# Patient Record
Sex: Male | Born: 1960 | Race: White | Hispanic: No | Marital: Married | Smoking: Never smoker
Health system: Southern US, Community
[De-identification: ages and names within clinical notes are randomized; demographics above are authoritative.]

## PROBLEM LIST (undated history)

## (undated) DIAGNOSIS — I10 Essential (primary) hypertension: Secondary | ICD-10-CM

## (undated) DIAGNOSIS — M109 Gout, unspecified: Secondary | ICD-10-CM

## (undated) HISTORY — PX: OTHER SURGICAL HISTORY: SHX169

## (undated) HISTORY — PX: TONSILLECTOMY: SHX5217

## (undated) HISTORY — DX: Gout, unspecified: M10.9

## (undated) HISTORY — DX: Essential (primary) hypertension: I10

## (undated) HISTORY — PX: HERNIA REPAIR: SHX51

---

## 2003-10-18 ENCOUNTER — Observation Stay (HOSPITAL_COMMUNITY): Admission: EM | Admit: 2003-10-18 | Discharge: 2003-10-18 | Payer: Self-pay | Admitting: Emergency Medicine

## 2013-07-16 ENCOUNTER — Telehealth: Payer: Self-pay | Admitting: Family Medicine

## 2013-07-16 MED ORDER — OLMESARTAN-AMLODIPINE-HCTZ 40-5-25 MG PO TABS: 1.0000 | ORAL_TABLET | Freq: Every day | ORAL | Status: DC

## 2013-07-16 MED ORDER — ALLOPURINOL 300 MG PO TABS: 300.0000 mg | ORAL_TABLET | Freq: Every day | ORAL | Status: DC

## 2013-07-16 NOTE — Telephone Encounter (Signed)
x

## 2013-07-18 ENCOUNTER — Other Ambulatory Visit: Payer: Self-pay | Admitting: Family Medicine

## 2013-07-18 MED ORDER — ALLOPURINOL 300 MG PO TABS: 300.0000 mg | ORAL_TABLET | Freq: Every day | ORAL | Status: DC

## 2013-07-18 NOTE — Telephone Encounter (Signed)
Refill for allopurinol sent in

## 2013-07-19 ENCOUNTER — Ambulatory Visit (INDEPENDENT_AMBULATORY_CARE_PROVIDER_SITE_OTHER): Payer: Managed Care, Other (non HMO) | Admitting: Family Medicine

## 2013-07-19 ENCOUNTER — Encounter: Payer: Self-pay | Admitting: Family Medicine

## 2013-07-19 VITALS — BP 143/86 | HR 79 | Temp 98.2°F | Ht 74.0 in | Wt 249.2 lb

## 2013-07-19 DIAGNOSIS — M109 Gout, unspecified: Secondary | ICD-10-CM

## 2013-07-19 DIAGNOSIS — Z0000 Encounter for general adult medical examination without abnormal findings: Secondary | ICD-10-CM

## 2013-07-19 DIAGNOSIS — I10 Essential (primary) hypertension: Secondary | ICD-10-CM

## 2013-07-19 LAB — POCT CBC
Granulocyte percent: 54.7 %G (ref 37–80)
HCT, POC: 44.8 % (ref 43.5–53.7)
Hemoglobin: 15.1 g/dL (ref 14.1–18.1)
Lymph, poc: 2.1 (ref 0.6–3.4)
MCH, POC: 32.3 pg — AB (ref 27–31.2)
MCHC: 33.8 g/dL (ref 31.8–35.4)
MCV: 95.7 fL (ref 80–97)
MPV: 8 fL (ref 0–99.8)
POC Granulocyte: 2.8 (ref 2–6.9)
POC LYMPH PERCENT: 41.2 %L (ref 10–50)
Platelet Count, POC: 211 10*3/uL (ref 142–424)
RBC: 4.7 M/uL (ref 4.69–6.13)
RDW, POC: 12.3 %
WBC: 5.1 10*3/uL (ref 4.6–10.2)

## 2013-07-19 MED ORDER — OLMESARTAN-AMLODIPINE-HCTZ 40-5-25 MG PO TABS: 1.0000 | ORAL_TABLET | Freq: Every day | ORAL | Status: DC

## 2013-07-19 MED ORDER — ALLOPURINOL 300 MG PO TABS: 300.0000 mg | ORAL_TABLET | Freq: Every day | ORAL | Status: DC

## 2013-07-19 MED ORDER — PREDNISONE 10 MG PO TABS: ORAL_TABLET | ORAL | Status: DC

## 2013-07-19 NOTE — Patient Instructions (Signed)
Gout  Gout is an inflammatory condition (arthritis) caused by a buildup of uric acid crystals in the joints. Uric acid is a chemical that is normally present in the blood. Under some circumstances, uric acid can form into crystals in your joints. This causes joint redness, soreness, and swelling (inflammation). Repeat attacks are common. Over time, uric acid crystals can form into masses (tophi) near a joint, causing disfigurement. Gout is treatable and often preventable.  CAUSES   The disease begins with elevated levels of uric acid in the blood. Uric acid is produced by your body when it breaks down a naturally found substance called purines. This also happens when you eat certain foods such as meats and fish. Causes of an elevated uric acid level include:   Being passed down from parent to child (heredity).   Diseases that cause increased uric acid production (obesity, psoriasis, some cancers).   Excessive alcohol use.   Diet, especially diets rich in meat and seafood.   Medicines, including certain cancer-fighting drugs (chemotherapy), diuretics, and aspirin.   Chronic kidney disease. The kidneys are no longer able to remove uric acid well.   Problems with metabolism.  Conditions strongly associated with gout include:   Obesity.   High blood pressure.   High cholesterol.   Diabetes.  Not everyone with elevated uric acid levels gets gout. It is not understood why some people get gout and others do not. Surgery, joint injury, and eating too much of certain foods are some of the factors that can lead to gout.  SYMPTOMS    An attack of gout comes on quickly. It causes intense pain with redness, swelling, and warmth in a joint.   Fever can occur.   Often, only one joint is involved. Certain joints are more commonly involved:   Base of the big toe.   Knee.   Ankle.   Wrist.   Finger.  Without treatment, an attack usually goes away in a few days to weeks. Between attacks, you usually will not have  symptoms, which is different from many other forms of arthritis.  DIAGNOSIS   Your caregiver will suspect gout based on your symptoms and exam. Removal of fluid from the joint (arthrocentesis) is done to check for uric acid crystals. Your caregiver will give you a medicine that numbs the area (local anesthetic) and use a needle to remove joint fluid for exam. Gout is confirmed when uric acid crystals are seen in joint fluid, using a special microscope. Sometimes, blood, urine, and X-ray tests are also used.  TREATMENT   There are 2 phases to gout treatment: treating the sudden onset (acute) attack and preventing attacks (prophylaxis).  Treatment of an Acute Attack   Medicines are used. These include anti-inflammatory medicines or steroid medicines.   An injection of steroid medicine into the affected joint is sometimes necessary.   The painful joint is rested. Movement can worsen the arthritis.   You may use warm or cold treatments on painful joints, depending which works best for you.   Discuss the use of coffee, vitamin C, or cherries with your caregiver. These may be helpful treatment options.  Treatment to Prevent Attacks  After the acute attack subsides, your caregiver may advise prophylactic medicine. These medicines either help your kidneys eliminate uric acid from your body or decrease your uric acid production. You may need to stay on these medicines for a very long time.  The early phase of treatment with prophylactic medicine can be associated   with an increase in acute gout attacks. For this reason, during the first few months of treatment, your caregiver may also advise you to take medicines usually used for acute gout treatment. Be sure you understand your caregiver's directions.  You should also discuss dietary treatment with your caregiver. Certain foods such as meats and fish can increase uric acid levels. Other foods such as dairy can decrease levels. Your caregiver can give you a list of foods  to avoid.  HOME CARE INSTRUCTIONS    Do not take aspirin to relieve pain. This raises uric acid levels.   Only take over-the-counter or prescription medicines for pain, discomfort, or fever as directed by your caregiver.   Rest the joint as much as possible. When in bed, keep sheets and blankets off painful areas.   Keep the affected joint raised (elevated).   Use crutches if the painful joint is in your leg.   Drink enough water and fluids to keep your urine clear or pale yellow. This helps your body get rid of uric acid. Do not drink alcoholic beverages. They slow the passage of uric acid.   Follow your caregiver's dietary instructions. Pay careful attention to the amount of protein you eat. Your daily diet should emphasize fruits, vegetables, whole grains, and fat-free or low-fat milk products.   Maintain a healthy body weight.  SEEK MEDICAL CARE IF:    You have an oral temperature above 102 F (38.9 C).   You develop diarrhea, vomiting, or any side effects from medicines.   You do not feel better in 24 hours, or you are getting worse.  SEEK IMMEDIATE MEDICAL CARE IF:    Your joint becomes suddenly more tender and you have:   Chills.   An oral temperature above 102 F (38.9 C), not controlled by medicine.  MAKE SURE YOU:    Understand these instructions.   Will watch your condition.   Will get help right away if you are not doing well or get worse.  Document Released: 10/15/2000 Document Revised: 01/10/2012 Document Reviewed: 01/26/2010  [employer] Patient Information 2014 [employer].

## 2013-07-19 NOTE — Progress Notes (Signed)
  Subjective:    Patient ID: Anthony Chavez, male    DOB: February 10, 1961, 52 y.o.   MRN: [id number]  HPI This 52 y.o. male presents for evaluation of needing med refill.  He has hx of gout and needs refills  On his allopurinol.  He has not had annual labs in over a year.   Review of Systems No chest pain, SOB, HA, dizziness, vision change, N/V, diarrhea, constipation, dysuria, urinary urgency or frequency, myalgias, arthralgias or rash.     Objective:   Physical Exam Vital signs noted  Well developed well nourished male.  HEENT - Head atraumatic Normocephalic                Eyes - PERRLA, Conjuctiva - clear Sclera- Clear EOMI                Ears - EAC's Wnl TM's Wnl Gross Hearing WNL                Nose - Nares patent                 Throat - oropharanx wnl Respiratory - Lungs CTA bilateral Cardiac - RRR S1 and S2 without murmur GI - Abdomen soft Nontender and bowel sounds active x 4 Extremities - No edema. Neuro - Grossly intact.       Assessment & Plan:  Essential hypertension, benign - Plan: Olmesartan-Amlodipine-HCTZ (TRIBENZOR) 40-5-25 MG TABS  Gout - Plan: allopurinol (ZYLOPRIM) 300 MG tablet, predniSONE (DELTASONE) 10 MG tablet, POCT CBC, CMP14+EGFR, Lipid panel, Uric acid, Thyroid Panel With TSH, PSA, total and free  Routine general medical examination at a health care facility - Plan: predniSONE (DELTASONE) 10 MG tablet, POCT CBC, CMP14+EGFR, Lipid panel, Uric acid, Thyroid Panel With TSH, PSA, total and free

## 2013-07-20 ENCOUNTER — Other Ambulatory Visit: Payer: Self-pay | Admitting: Family Medicine

## 2013-07-20 DIAGNOSIS — M109 Gout, unspecified: Secondary | ICD-10-CM

## 2013-07-20 DIAGNOSIS — E785 Hyperlipidemia, unspecified: Secondary | ICD-10-CM

## 2013-07-20 LAB — CMP14+EGFR
ALT: 37 IU/L (ref 0–44)
AST: 35 IU/L (ref 0–40)
Albumin/Globulin Ratio: 2.1 (ref 1.1–2.5)
Albumin: 5.1 g/dL (ref 3.5–5.5)
Alkaline Phosphatase: 52 IU/L (ref 39–117)
BUN/Creatinine Ratio: 12 (ref 9–20)
BUN: 14 mg/dL (ref 6–24)
CO2: 28 mmol/L (ref 18–29)
Calcium: 10.2 mg/dL (ref 8.7–10.2)
Chloride: 94 mmol/L — ABNORMAL LOW (ref 97–108)
Creatinine, Ser: 1.15 mg/dL (ref 0.76–1.27)
GFR calc Af Amer: 85 mL/min/{1.73_m2} (ref >59)
GFR calc non Af Amer: 73 mL/min/{1.73_m2} (ref >59)
Globulin, Total: 2.4 g/dL (ref 1.5–4.5)
Glucose: 97 mg/dL (ref 65–99)
Potassium: 4 mmol/L (ref 3.5–5.2)
Sodium: 141 mmol/L (ref 134–144)
Total Bilirubin: 1 mg/dL (ref 0.0–1.2)
Total Protein: 7.5 g/dL (ref 6.0–8.5)

## 2013-07-20 LAB — LIPID PANEL
Chol/HDL Ratio: 4.1 ratio units (ref 0.0–5.0)
Cholesterol, Total: 363 mg/dL — ABNORMAL HIGH (ref 100–199)
HDL: 88 mg/dL (ref >39)
LDL Calculated: 218 mg/dL — ABNORMAL HIGH (ref 0–99)
Triglycerides: 286 mg/dL — ABNORMAL HIGH (ref 0–149)
VLDL Cholesterol Cal: 57 mg/dL — ABNORMAL HIGH (ref 5–40)

## 2013-07-20 LAB — PSA, TOTAL AND FREE
PSA, Free Pct: 58.6 %
PSA, Free: 0.41 ng/mL
PSA: 0.7 ng/mL (ref 0.0–4.0)

## 2013-07-20 LAB — THYROID PANEL WITH TSH
Free Thyroxine Index: 2.7 (ref 1.2–4.9)
T3 Uptake Ratio: 31 % (ref 24–39)
T4, Total: 8.6 ug/dL (ref 4.5–12.0)
TSH: 3.32 u[IU]/mL (ref 0.450–4.500)

## 2013-07-20 LAB — URIC ACID: Uric Acid: 7.1 mg/dL (ref 3.7–8.6)

## 2013-07-20 MED ORDER — ALLOPURINOL 100 MG PO TABS: ORAL_TABLET | ORAL | Status: DC

## 2013-07-20 MED ORDER — ROSUVASTATIN CALCIUM 10 MG PO TABS: 10.0000 mg | ORAL_TABLET | Freq: Every day | ORAL | Status: DC

## 2013-07-30 ENCOUNTER — Telehealth: Payer: Self-pay | Admitting: Family Medicine

## 2014-08-19 ENCOUNTER — Telehealth: Payer: Self-pay | Admitting: Family Medicine

## 2014-08-19 NOTE — Telephone Encounter (Signed)
Need to know dose of allopurinol Check on tribenzor samples

## 2014-08-20 NOTE — Telephone Encounter (Signed)
Patient aware we do not carry those samples told to call back if he needs us to call them into a pharmacy.

## 2014-08-21 ENCOUNTER — Ambulatory Visit (INDEPENDENT_AMBULATORY_CARE_PROVIDER_SITE_OTHER): Payer: BC Managed Care – PPO | Admitting: Nurse Practitioner

## 2014-08-21 ENCOUNTER — Encounter: Payer: Self-pay | Admitting: Nurse Practitioner

## 2014-08-21 VITALS — BP 156/92 | HR 74 | Temp 98.6°F | Ht 74.0 in | Wt 254.0 lb

## 2014-08-21 DIAGNOSIS — M1A09X0 Idiopathic chronic gout, multiple sites, without tophus (tophi): Secondary | ICD-10-CM

## 2014-08-21 DIAGNOSIS — M109 Gout, unspecified: Secondary | ICD-10-CM | POA: Insufficient documentation

## 2014-08-21 DIAGNOSIS — Z125 Encounter for screening for malignant neoplasm of prostate: Secondary | ICD-10-CM

## 2014-08-21 DIAGNOSIS — E785 Hyperlipidemia, unspecified: Secondary | ICD-10-CM

## 2014-08-21 DIAGNOSIS — I10 Essential (primary) hypertension: Secondary | ICD-10-CM

## 2014-08-21 MED ORDER — ALLOPURINOL 100 MG PO TABS: ORAL_TABLET | ORAL | Status: DC

## 2014-08-21 MED ORDER — OLMESARTAN-AMLODIPINE-HCTZ 40-5-25 MG PO TABS: 1.0000 | ORAL_TABLET | Freq: Every day | ORAL | Status: DC

## 2014-08-21 NOTE — Patient Instructions (Signed)

## 2014-08-21 NOTE — Progress Notes (Signed)
Subjective:    Patient ID: Anthony Chavez, male    DOB: 02-Mar-1961, 53 y.o.   MRN: [id number]  Patient here today for follow up of chronic medical problems.  Hypertension This is a chronic problem. The current episode started more than 1 year ago. The problem has been waxing and waning since onset. The problem is uncontrolled (has been out of meds for a week.). Pertinent negatives include no chest pain, headaches, neck pain, palpitations, peripheral edema or sweats. Risk factors for coronary artery disease include dyslipidemia, obesity and male gender. Past treatments include angiotensin blockers, calcium channel blockers and diuretics. The current treatment provides significant improvement. Compliance problems include diet and exercise.   Hyperlipidemia This is a chronic problem. The current episode started more than 1 year ago. The problem is uncontrolled. Recent lipid tests were reviewed and are high. He has no history of obesity. Pertinent negatives include no chest pain. Treatments tried: was suppose to be on crestor but patient just didn't like taking so he stopped and has been watching diet. There are no compliance problems.  Risk factors for coronary artery disease include dyslipidemia, hypertension and male sex.  GOUT  No flare ups since initial flare-up- Is on allopurinol daily.   Review of Systems  Constitutional: Negative.   HENT: Negative.   Respiratory: Negative.   Cardiovascular: Negative for chest pain and palpitations.  Genitourinary: Negative.   Musculoskeletal: Negative for neck pain.  Neurological: Negative for headaches.  Psychiatric/Behavioral: Negative.   All other systems reviewed and are negative.      Objective:   Physical Exam  Constitutional: He is oriented to person, place, and time. He appears well-developed and well-nourished.  HENT:  Head: Normocephalic.  Right Ear: External ear normal.  Left Ear: External ear normal.  Nose: Nose normal.    Mouth/Throat: Oropharynx is clear and moist.  Eyes: EOM are normal. Pupils are equal, round, and reactive to light.  Neck: Normal range of motion. Neck supple. No JVD present. No thyromegaly present.  Cardiovascular: Normal rate, regular rhythm, normal heart sounds and intact distal pulses.  Exam reveals no gallop and no friction rub.   No murmur heard. Pulmonary/Chest: Effort normal and breath sounds normal. No respiratory distress. He has no wheezes. He has no rales. He exhibits no tenderness.  Abdominal: Soft. Bowel sounds are normal. He exhibits no mass. There is no tenderness.  Genitourinary:  Refuses prostate check.  Musculoskeletal: Normal range of motion. He exhibits no edema.  Lymphadenopathy:    He has no cervical adenopathy.  Neurological: He is alert and oriented to person, place, and time. No cranial nerve deficit.  Skin: Skin is warm and dry.  Psychiatric: He has a normal mood and affect. His behavior is normal. Judgment and thought content normal.   BP 156/92  Pulse 74  Temp(Src) 98.6 F (37 C) (Oral)  Ht _0  (1.88 m)  Wt 254 lb (115.214 kg)  BMI 32.60 kg/m2        Assessment & Plan:  1. Essential hypertension, benign Low NA+ diet Try not to run out of meds in the fiture - CMP14+EGFR - Olmesartan-Amlodipine-HCTZ (TRIBENZOR) 40-5-25 MG TABS; Take 1 tablet by mouth daily.  Dispense: 30 tablet; Refill: 11  2. Hyperlipidemia with target LDL less than 100 Low fat diet - NMR, lipoprofile  3. Idiopathic chronic gout of multiple sites without tophus Low purine diet - allopurinol (ZYLOPRIM) 100 MG tablet; Take one po in evening  Dispense: 30 tablet; Refill: 11  4. Prostate cancer screening - PSA, total and free   Refuses immunizations and colonoscopy Labs pending Health maintenance reviewed Diet and exercise encouraged Continue all meds Follow up  In 3 months   [place], FNP

## 2014-08-22 LAB — NMR, LIPOPROFILE
Cholesterol: 311 mg/dL — ABNORMAL HIGH (ref 100–199)
HDL Cholesterol by NMR: 84 mg/dL (ref >39)
HDL Particle Number: 44.7 umol/L (ref >=30.5)
LDL Particle Number: 1328 nmol/L — ABNORMAL HIGH (ref <1000)
LDL Size: 22 nm (ref >20.5)
LDLC SERPL CALC-MCNC: 200 mg/dL — ABNORMAL HIGH (ref 0–99)
LP-IR Score: <25 (ref <=45)
Small LDL Particle Number: 127 nmol/L (ref <=527)
Triglycerides by NMR: 134 mg/dL (ref 0–149)

## 2014-08-22 LAB — CMP14+EGFR
ALT: 35 IU/L (ref 0–44)
AST: 37 IU/L (ref 0–40)
Albumin/Globulin Ratio: 2.3 (ref 1.1–2.5)
Albumin: 4.8 g/dL (ref 3.5–5.5)
Alkaline Phosphatase: 40 IU/L (ref 39–117)
BUN/Creatinine Ratio: 16 (ref 9–20)
BUN: 19 mg/dL (ref 6–24)
CO2: 25 mmol/L (ref 18–29)
Calcium: 9.4 mg/dL (ref 8.7–10.2)
Chloride: 102 mmol/L (ref 97–108)
Creatinine, Ser: 1.2 mg/dL (ref 0.76–1.27)
GFR calc Af Amer: 80 mL/min/{1.73_m2} (ref >59)
GFR calc non Af Amer: 69 mL/min/{1.73_m2} (ref >59)
Globulin, Total: 2.1 g/dL (ref 1.5–4.5)
Glucose: 102 mg/dL — ABNORMAL HIGH (ref 65–99)
Potassium: 4.6 mmol/L (ref 3.5–5.2)
Sodium: 146 mmol/L — ABNORMAL HIGH (ref 134–144)
Total Bilirubin: 0.5 mg/dL (ref 0.0–1.2)
Total Protein: 6.9 g/dL (ref 6.0–8.5)

## 2014-08-22 LAB — PSA, TOTAL AND FREE
PSA, Free Pct: 53.3 %
PSA, Free: 0.32 ng/mL
PSA: 0.6 ng/mL (ref 0.0–4.0)

## 2014-08-27 ENCOUNTER — Telehealth: Payer: Self-pay | Admitting: Family Medicine

## 2014-08-27 NOTE — Telephone Encounter (Signed)
Message copied by Azalee CourseFULP, Mylin Hirano on Tue Aug 27, 2014 10:08 AM ------      Message from: Bennie PieriniMARTIN, MARY-MARGARET      Created: Thu Aug 22, 2014  6:14 PM       Kidney and liver function stable      Na+ is a little high- watch NA in diet      LDL particle numbers and LDL are elevated- really need to go on crestor      Continue current meds- low fat diet and exercise and recheck in 3 months             ------

## 2014-08-28 ENCOUNTER — Encounter: Payer: Self-pay | Admitting: Family Medicine

## 2015-07-31 ENCOUNTER — Other Ambulatory Visit: Payer: Self-pay | Admitting: Nurse Practitioner

## 2015-07-31 NOTE — Telephone Encounter (Signed)
Last seen 08/21/14  MMM 

## 2015-07-31 NOTE — Telephone Encounter (Signed)
Sent in 30 day prescription for both, needs to be seen before any more refills.

## 2015-08-25 ENCOUNTER — Encounter: Payer: Self-pay | Admitting: Family Medicine

## 2015-08-25 ENCOUNTER — Ambulatory Visit (INDEPENDENT_AMBULATORY_CARE_PROVIDER_SITE_OTHER): Payer: BLUE CROSS/BLUE SHIELD | Admitting: Family Medicine

## 2015-08-25 VITALS — BP 127/78 | HR 86 | Temp 98.1°F | Ht 74.0 in | Wt 229.0 lb

## 2015-08-25 DIAGNOSIS — Z1159 Encounter for screening for other viral diseases: Secondary | ICD-10-CM | POA: Insufficient documentation

## 2015-08-25 DIAGNOSIS — E785 Hyperlipidemia, unspecified: Secondary | ICD-10-CM

## 2015-08-25 DIAGNOSIS — Z0000 Encounter for general adult medical examination without abnormal findings: Secondary | ICD-10-CM | POA: Insufficient documentation

## 2015-08-25 DIAGNOSIS — N529 Male erectile dysfunction, unspecified: Secondary | ICD-10-CM

## 2015-08-25 DIAGNOSIS — M1A09X0 Idiopathic chronic gout, multiple sites, without tophus (tophi): Secondary | ICD-10-CM

## 2015-08-25 DIAGNOSIS — I10 Essential (primary) hypertension: Secondary | ICD-10-CM

## 2015-08-25 MED ORDER — SILDENAFIL CITRATE 50 MG PO TABS: 25.0000 mg | ORAL_TABLET | Freq: Every day | ORAL | Status: DC | PRN

## 2015-08-25 MED ORDER — OLMESARTAN-AMLODIPINE-HCTZ 40-5-25 MG PO TABS: 1.0000 | ORAL_TABLET | Freq: Every day | ORAL | Status: DC

## 2015-08-25 MED ORDER — ALLOPURINOL 100 MG PO TABS: 100.0000 mg | ORAL_TABLET | Freq: Every evening | ORAL | Status: DC

## 2015-08-25 NOTE — Progress Notes (Signed)
   HPI  Patient presents today for follow-up hypertension and other chronic medical conditions.  Hypertension Did not comply No chest pain, dyspnea, palpitations, leg edema Does not report checking blood pressure home.  Exercises regularly about 3 times a week.  Gout No flares for over a year, no problems with allopurinol.  Hyperlipidemia States that he feels fine and he does not want to take medication at this time, not fasting  Erectile dysfunction Describes less vigorous erection than previous her several months, would like to try Viagra  PMH: Smoking status noted ROS: Per HPI  Objective: BP 127/78 mmHg  Pulse 86  Temp(Src) 98.1 F (36.7 C) (Oral)  Ht _0  (1.88 m)  Wt 229 lb (103.874 kg)  BMI 29.39 kg/m2 Gen: NAD, alert, cooperative with exam HEENT: NCAT, TMs normal bilaterally, nares clear, oropharynx clear Neck: No tender lymphadenopathy, no thyromegaly CV: RRR, good S1/S2, no murmur Resp: CTABL, no wheezes, non-labored Abd: SNTND, BS present, no guarding or organomegaly Ext: No edema, warm Neuro: Alert and oriented, No gross deficits  Assessment and plan:  # Retention Refill Tribenzor, continue current dose HCTZ can cause increased gout flares somewhat change this if he has more gout concerns.  # Gout Continue allopurinol, check uric acid for goal less than 6  # Hyperlipidemia Labs, does not want to take statin at this time  # Healthcare maintenance Declines flu shot Nonfasting labs done today Discussed colonoscopy, he has not opened in this at this time  # Erectile dysfunction Trial of viagra, Revatio if cost is too high    Orders Placed This Encounter  Procedures  . CMP14+EGFR  . CBC  . Hepatitis C antibody  . Lipid Panel  . HIV antibody  . Uric acid    Meds ordered this encounter  Medications  . sildenafil (VIAGRA) 50 MG tablet    Sig: Take 0.5-1 tablets (25-50 mg total) by mouth daily as needed for erectile dysfunction.   Dispense:  10 tablet    Refill:  0  . allopurinol (ZYLOPRIM) 100 MG tablet    Sig: Take 1 tablet (100 mg total) by mouth every evening.    Dispense:  90 tablet    Refill:  3  . Olmesartan-Amlodipine-HCTZ (TRIBENZOR) 40-5-25 MG TABS    Sig: Take 1 tablet by mouth daily.    Dispense:  90 tablet    Refill:  [place], MD [facility] 08/25/2015, 2:17 PM

## 2015-08-25 NOTE — Patient Instructions (Signed)
Great to meet you!  If viagra is too expensive, call us, I can send the less expensive one.   We will call with your lab results in about a week

## 2015-08-26 LAB — CMP14+EGFR
ALT: 49 IU/L — ABNORMAL HIGH (ref 0–44)
AST: 51 IU/L — ABNORMAL HIGH (ref 0–40)
Albumin/Globulin Ratio: 1.7 (ref 1.1–2.5)
Albumin: 4.7 g/dL (ref 3.5–5.5)
Alkaline Phosphatase: 41 IU/L (ref 39–117)
BUN/Creatinine Ratio: 19 (ref 9–20)
BUN: 25 mg/dL — ABNORMAL HIGH (ref 6–24)
Bilirubin Total: 0.3 mg/dL (ref 0.0–1.2)
CO2: 20 mmol/L (ref 18–29)
Calcium: 9.1 mg/dL (ref 8.7–10.2)
Chloride: 100 mmol/L (ref 97–106)
Creatinine, Ser: 1.31 mg/dL — ABNORMAL HIGH (ref 0.76–1.27)
GFR calc Af Amer: 71 mL/min/{1.73_m2} (ref >59)
GFR calc non Af Amer: 62 mL/min/{1.73_m2} (ref >59)
Globulin, Total: 2.7 g/dL (ref 1.5–4.5)
Glucose: 111 mg/dL — ABNORMAL HIGH (ref 65–99)
Potassium: 4 mmol/L (ref 3.5–5.2)
Sodium: 144 mmol/L (ref 136–144)
Total Protein: 7.4 g/dL (ref 6.0–8.5)

## 2015-08-26 LAB — CBC
Hematocrit: 41.1 % (ref 37.5–51.0)
Hemoglobin: 13.7 g/dL (ref 12.6–17.7)
MCH: 32.5 pg (ref 26.6–33.0)
MCHC: 33.3 g/dL (ref 31.5–35.7)
MCV: 97 fL (ref 79–97)
Platelets: 253 10*3/uL (ref 150–379)
RBC: 4.22 x10E6/uL (ref 4.14–5.80)
RDW: 13.2 % (ref 12.3–15.4)
WBC: 4.9 10*3/uL (ref 3.4–10.8)

## 2015-08-26 LAB — HEPATITIS C ANTIBODY: Hep C Virus Ab: <0.1 s/co ratio (ref 0.0–0.9)

## 2015-08-26 LAB — URIC ACID: Uric Acid: 8.1 mg/dL (ref 3.7–8.6)

## 2015-08-26 LAB — LIPID PANEL
Chol/HDL Ratio: 3.4 ratio units (ref 0.0–5.0)
Cholesterol, Total: 339 mg/dL — ABNORMAL HIGH (ref 100–199)
HDL: 100 mg/dL (ref >39)
LDL Calculated: 215 mg/dL — ABNORMAL HIGH (ref 0–99)
Triglycerides: 120 mg/dL (ref 0–149)
VLDL Cholesterol Cal: 24 mg/dL (ref 5–40)

## 2015-08-26 LAB — HIV ANTIBODY (ROUTINE TESTING W REFLEX): HIV Screen 4th Generation wRfx: NONREACTIVE

## 2015-08-27 ENCOUNTER — Other Ambulatory Visit: Payer: Self-pay | Admitting: Nurse Practitioner

## 2015-08-28 ENCOUNTER — Other Ambulatory Visit: Payer: Self-pay | Admitting: Family Medicine

## 2015-08-28 MED ORDER — ALLOPURINOL 100 MG PO TABS: 200.0000 mg | ORAL_TABLET | Freq: Every day | ORAL | Status: DC

## 2015-09-02 ENCOUNTER — Telehealth: Payer: Self-pay | Admitting: Family Medicine

## 2015-09-02 MED ORDER — SILDENAFIL CITRATE 20 MG PO TABS: ORAL_TABLET | ORAL | Status: DC

## 2015-09-02 NOTE — Telephone Encounter (Signed)
Called, he used coupon to get viagra.   Murtis SinkSam Bradshaw, MD [facility] 09/02/2015, 5:37 PM

## 2016-07-26 ENCOUNTER — Other Ambulatory Visit: Payer: Self-pay | Admitting: Pediatrics

## 2016-10-04 ENCOUNTER — Other Ambulatory Visit: Payer: Self-pay | Admitting: Family Medicine

## 2016-10-04 NOTE — Telephone Encounter (Signed)
Patient last seen 08/25/15, please advise and route to pools

## 2016-10-07 ENCOUNTER — Ambulatory Visit (INDEPENDENT_AMBULATORY_CARE_PROVIDER_SITE_OTHER): Payer: BLUE CROSS/BLUE SHIELD | Admitting: Pediatrics

## 2016-10-07 ENCOUNTER — Encounter: Payer: Self-pay | Admitting: Pediatrics

## 2016-10-07 ENCOUNTER — Encounter (INDEPENDENT_AMBULATORY_CARE_PROVIDER_SITE_OTHER): Payer: Self-pay

## 2016-10-07 VITALS — BP 138/89 | HR 93 | Temp 97.6°F | Ht 74.0 in | Wt 244.0 lb

## 2016-10-07 DIAGNOSIS — I10 Essential (primary) hypertension: Secondary | ICD-10-CM | POA: Diagnosis not present

## 2016-10-07 DIAGNOSIS — R251 Tremor, unspecified: Secondary | ICD-10-CM

## 2016-10-07 DIAGNOSIS — J069 Acute upper respiratory infection, unspecified: Secondary | ICD-10-CM | POA: Diagnosis not present

## 2016-10-07 DIAGNOSIS — M109 Gout, unspecified: Secondary | ICD-10-CM | POA: Diagnosis not present

## 2016-10-07 MED ORDER — ALLOPURINOL 100 MG PO TABS: 200.0000 mg | ORAL_TABLET | Freq: Every day | ORAL | 0 refills | Status: DC

## 2016-10-07 MED ORDER — OLMESARTAN-AMLODIPINE-HCTZ 40-5-25 MG PO TABS: 1.0000 | ORAL_TABLET | Freq: Every day | ORAL | 0 refills | Status: DC

## 2016-10-07 NOTE — Progress Notes (Signed)
  Subjective:   Patient ID: Anthony Chavez, male    DOB: 01/17/1961, 55 y.o.   MRN: [id number] CC: Fever; Diarrhea; Night Sweats; and Sore Throat  HPI: Anthony Chavez is a 55 y.o. male presenting for Fever; Diarrhea; Night Sweats; and Sore Throat  Started three days ago Night sweats first two nights, slept much better last night Felt better this morning Had sore throat, still slgihtly sore Feeling better today Still slight pain  Some diarrhea Subjective fevers Appetite slighlty down Drinking plenty of fluids Minimal coughing  Tremor: started within last few months Has been worsening Notices it when he is most concentrating/stressed such as when trying to write carefully on form he can't mess up If he is relaxed writing comes easier Primarily hands No resting tremor Some tremor present LE  HTN: needs refill No recent CPE  Gout: no recent flares, needs refill allopurinol No recent CPE  Relevant past medical, surgical, family and social history reviewed. Allergies and medications reviewed and updated. History  Smoking Status  . Never Smoker  Smokeless Tobacco  . Never Used   ROS: Per HPI   Objective:    BP 138/89   Pulse 93   Temp 97.6 F (36.4 C) (Oral)   Ht 6\' 2"  (1.88 m)   Wt 244 lb (110.7 kg)   BMI 31.33 kg/m   Wt Readings from Last 3 Encounters:  10/07/16 244 lb (110.7 kg)  08/25/15 229 lb (103.9 kg)  08/21/14 254 lb (115.2 kg)    Gen: NAD, alert, cooperative with exam, NCAT EYES: EOMI, no conjunctival injection, or no icterus ENT:  TMs slighlty pink b/l, OP without erythema LYMPH: no cervical LAD CV: NRRR, normal S1/S2, no murmur, distal pulses 2+ b/l Resp: CTABL, no wheezes, normal WOB Abd: +BS, soft, NTND.  Ext: No edema, warm Neuro: Alert and oriented, hand grip 5/5 b/l, intention tremor b/l UE, no resting tremor Assessment & Plan:  Anthony Chavez was seen today for fever, diarrhea, night sweats and sore throat.  Diagnoses and all orders for this  visit:  Acute URI Improved symptoms today Discussed symptomatic care, return precautions  Tremor New intention tremor over last few months, worsening No fam hx of tremor -     Ambulatory referral to Neurology  Essential hypertension, benign Gave 30 days refills, needs CPE -     Olmesartan-Amlodipine-HCTZ (TRIBENZOR) 40-5-25 MG TABS; Take 1 tablet by mouth daily.  Gout, unspecified cause, unspecified chronicity, unspecified site Gave 30 days refills, needs CPE -     allopurinol (ZYLOPRIM) 100 MG tablet; Take 2 tablets (200 mg total) by mouth daily.  Follow up plan: Return in about 1 week (around 10/14/2016) for CPE. Rex Krasarol Vincent, MD [facility]

## 2016-10-20 ENCOUNTER — Ambulatory Visit (INDEPENDENT_AMBULATORY_CARE_PROVIDER_SITE_OTHER): Payer: BLUE CROSS/BLUE SHIELD | Admitting: Pediatrics

## 2016-10-20 ENCOUNTER — Encounter: Payer: Self-pay | Admitting: Pediatrics

## 2016-10-20 VITALS — BP 109/69 | HR 75 | Temp 97.6°F | Ht 74.0 in | Wt 248.2 lb

## 2016-10-20 DIAGNOSIS — I10 Essential (primary) hypertension: Secondary | ICD-10-CM

## 2016-10-20 DIAGNOSIS — Z0000 Encounter for general adult medical examination without abnormal findings: Secondary | ICD-10-CM

## 2016-10-20 DIAGNOSIS — Z6831 Body mass index (BMI) 31.0-31.9, adult: Secondary | ICD-10-CM

## 2016-10-20 DIAGNOSIS — Z1211 Encounter for screening for malignant neoplasm of colon: Secondary | ICD-10-CM

## 2016-10-20 DIAGNOSIS — E785 Hyperlipidemia, unspecified: Secondary | ICD-10-CM

## 2016-10-20 DIAGNOSIS — M109 Gout, unspecified: Secondary | ICD-10-CM

## 2016-10-20 LAB — BAYER DCA HB A1C WAIVED: HB A1C (BAYER DCA - WAIVED): 5.6 % (ref <7.0)

## 2016-10-20 MED ORDER — ALLOPURINOL 100 MG PO TABS: 200.0000 mg | ORAL_TABLET | Freq: Every day | ORAL | 3 refills | Status: DC

## 2016-10-20 MED ORDER — OLMESARTAN-AMLODIPINE-HCTZ 40-5-25 MG PO TABS: 1.0000 | ORAL_TABLET | Freq: Every day | ORAL | 3 refills | Status: DC

## 2016-10-20 NOTE — Progress Notes (Signed)
  Subjective:   Patient ID: Anthony Chavez, male    DOB: 12-23-1960, 55 y.o.   MRN: [id number] CC: Annual Exam  HPI: Anthony Chavez is a 55 y.o. male presenting for Annual Exam  BMI elevated: Was going to gym regularly Work now much more stressful Working on days off, not able to go to gym at lunch break  HTN: no CP, no SOB, no lightheadedness, dizziness Taking meds regularly  Does not want colonoscopy or stool cards for colon ca screening  Does not want statin medication for cholesterol  No trouble starting/keeping stream of urine  Does not know much of his fmaily's history bc he doesn't talk with his family  Today is a better day with tremor in hand, has appt with neurology next month  No recent gout flares Takes allopurinol regularly  Mood has been good  Relevant past medical, surgical, family and social history reviewed. Allergies and medications reviewed and updated. History  Smoking Status  . Never Smoker  Smokeless Tobacco  . Never Used   ROS: All systems negative other than what is in HPI  Objective:    BP 109/69   Pulse 75   Temp 97.6 F (36.4 C) (Oral)   Ht _0  (1.88 m)   Wt 248 lb 3.2 oz (112.6 kg)   BMI 31.87 kg/m   Wt Readings from Last 3 Encounters:  10/20/16 248 lb 3.2 oz (112.6 kg)  10/07/16 244 lb (110.7 kg)  08/25/15 229 lb (103.9 kg)    Gen: NAD, alert, cooperative with exam, NCAT EYES: EOMI, no conjunctival injection, or no icterus ENT:  L ear still with clear effusion, R TM normal. OP without erythema LYMPH: no cervical LAD CV: NRRR, normal S1/S2, no murmur, distal pulses 2+ b/l Resp: CTABL, no wheezes, normal WOB Abd: +BS, soft, NTND. no guarding or organomegaly Ext: No edema, warm Neuro: Alert and oriented MSK: normal muscle bulk  Assessment & Plan:  Marco was seen today for annual exam.  Diagnoses and all orders for this visit:  Encounter for preventive health examination -     PSA, total and free -     Lipid panel -      CMP14+EGFR -     Bayer DCA Hb A1c Waived  Gout, unspecified cause, unspecified chronicity, unspecified site Well controlled, no recent flares Cont below -     allopurinol (ZYLOPRIM) 100 MG tablet; Take 2 tablets (200 mg total) by mouth daily.  Essential hypertension, benign Well controlled, labs today, cont meds -     Olmesartan-Amlodipine-HCTZ (TRIBENZOR) 40-5-25 MG TABS; Take 1 tablet by mouth daily.  Colon cancer screening Pt declines both colonoscopy and annual stool cards Regular stooling, no blood in bowel movements Unknown fam hx  Hyperlipidemia, unspecified hyperlipidemia type LDL multiple times has been over 200 per chart review Pt says he doesn't want statin therapy OK with rechecking lipid panel today, will consider statin   BMI 31 Discussed lifestyle changes, increase exercise, minimize fast food (now a few times a week, avoids sugary drinks, sodas)  Follow up plan: Return in about 1 year (around 10/20/2017). Assunta Found, MD [place]

## 2016-10-21 LAB — CMP14+EGFR
ALT: 23 IU/L (ref 0–44)
AST: 23 IU/L (ref 0–40)
Albumin/Globulin Ratio: 1.8 (ref 1.2–2.2)
Albumin: 4.4 g/dL (ref 3.5–5.5)
Alkaline Phosphatase: 51 IU/L (ref 39–117)
BUN/Creatinine Ratio: 20 (ref 9–20)
BUN: 30 mg/dL — ABNORMAL HIGH (ref 6–24)
Bilirubin Total: 0.3 mg/dL (ref 0.0–1.2)
CO2: 23 mmol/L (ref 18–29)
Calcium: 9.5 mg/dL (ref 8.7–10.2)
Chloride: 99 mmol/L (ref 96–106)
Creatinine, Ser: 1.49 mg/dL — ABNORMAL HIGH (ref 0.76–1.27)
GFR calc Af Amer: 61 mL/min/{1.73_m2} (ref >59)
GFR calc non Af Amer: 52 mL/min/{1.73_m2} — ABNORMAL LOW (ref >59)
Globulin, Total: 2.4 g/dL (ref 1.5–4.5)
Glucose: 91 mg/dL (ref 65–99)
Potassium: 4.5 mmol/L (ref 3.5–5.2)
Sodium: 141 mmol/L (ref 134–144)
Total Protein: 6.8 g/dL (ref 6.0–8.5)

## 2016-10-21 LAB — PSA, TOTAL AND FREE
PSA, Free Pct: 51.4 %
PSA, Free: 0.36 ng/mL
Prostate Specific Ag, Serum: 0.7 ng/mL (ref 0.0–4.0)

## 2016-10-21 LAB — LIPID PANEL
Chol/HDL Ratio: 3.9 ratio units (ref 0.0–5.0)
Cholesterol, Total: 295 mg/dL — ABNORMAL HIGH (ref 100–199)
HDL: 75 mg/dL (ref >39)
LDL Calculated: 175 mg/dL — ABNORMAL HIGH (ref 0–99)
Triglycerides: 224 mg/dL — ABNORMAL HIGH (ref 0–149)
VLDL Cholesterol Cal: 45 mg/dL — ABNORMAL HIGH (ref 5–40)

## 2016-11-18 NOTE — Progress Notes (Signed)
Subjective:   Anthony Chavez was seen in consultation in the movement disorder clinic at the request of Dr. Oswaldo Done.  He is accompanied by his wife who supplements the history.  The evaluation is for tremor.  Tremor started approximately 2 ago and involves the bilateral UE.  He is R hand dominant but both hands shake equally.  He has good and bad days.  Physical activity makes the tremor worse.   Tremor is worse than it was 2 years ago.   There is no known family hx of tremor.  Neuroimaging has never been completed of the brain.    Affected by caffeine:  No. (only drinks 1 cup coffee per day) Affected by alcohol:  Yes.   - worse the next day per wife (drinks 1 pint hard liquor per day) - wife states that he goes for EtOH when he has bad tremor day.  Pt also states that this amount of EtOH is less than used to be.  Wife notes that he had cut down more but has increased some again.  Tried to d/c "cold [place]" but lack of sleep/increased tremor became big issue.   Affected by stress:  No. Affected by fatigue:  Yes.   Spills soup if on spoon:  May or may not Spills glass of liquid if full:  Yes.   (carry with 2 hands) Affects ADL's (tying shoes, brushing teeth, etc):  No.  Current/Previously tried tremor medications: none  Current medications that may exacerbate tremor:  none  Outside reports reviewed: historical medical records, lab reports and office notes.  No Known Allergies  Outpatient Encounter Prescriptions as of 11/22/2016  Medication Sig  . allopurinol (ZYLOPRIM) 100 MG tablet Take 2 tablets (200 mg total) by mouth daily.  . Olmesartan-Amlodipine-HCTZ (TRIBENZOR) 40-5-25 MG TABS Take 1 tablet by mouth daily.   No facility-administered encounter medications on file as of 11/22/2016.     Past Medical History:  Diagnosis Date  . Gout   . Hypertension     Past Surgical History:  Procedure Laterality Date  . HERNIA REPAIR    . ring finger surgery Left   . TONSILLECTOMY       Social History   Social History  . Marital status: Single    Spouse name: N/A  . Number of children: N/A  . Years of education: N/A   Occupational History  . aviation maintenance    Social History Main Topics  . Smoking status: Never Smoker  . Smokeless tobacco: Never Used  . Alcohol use Yes     Comment: 1 pint hard liquor per day  . Drug use: No  . Sexual activity: Not on file   Other Topics Concern  . Not on file   Social History Narrative  . No narrative on file    Family Status  Relation Status  . Brother Alive  . Son Alive  . Daughter Alive    Review of Systems A complete 10 system ROS was obtained and was negative apart from what is mentioned.   Objective:   VITALS:   Vitals:   11/22/16 1332  BP: (!) 146/80  Pulse: 97  Weight: 247 lb (112 kg)  Height: 6\' 2"  (1.88 m)   Gen:  Appears stated age and in NAD. HEENT:  Normocephalic, atraumatic. The mucous membranes are moist. The superficial temporal arteries are without ropiness or tenderness. Cardiovascular: Regular rate and rhythm. Lungs: Clear to auscultation bilaterally. Neck: There are no carotid bruits noted bilaterally.  NEUROLOGICAL:  Orientation:  The patient is alert and oriented x 3.  Recent and remote memory are intact.  Attention span and concentration are normal.  Able to name objects and repeat without trouble.  Fund of knowledge is appropriate Cranial nerves: There is good facial symmetry. The pupils are equal round and reactive to light bilaterally. Fundoscopic exam reveals clear disc margins bilaterally. Extraocular muscles are intact and visual fields are full to confrontational testing. Speech is fluent and clear. Soft palate rises symmetrically and there is no tongue deviation. Hearing is intact to conversational tone. Tone: Tone is good throughout. Sensation: Sensation is intact to light touch and pinprick throughout (facial, trunk, extremities). Vibration is Decreased at the  bilateral big toe and ankle. There is no extinction with double simultaneous stimulation. There is no sensory dermatomal level identified. Coordination:  The patient has no trouble with finger-nose-finger with the eyes open, but he does have some difficulty with the eyes closed, primarily because of tremor.  He has no trouble with rapid alternating movements. Motor: Strength is 5/5 in the bilateral upper and lower extremities.  Shoulder shrug is equal bilaterally.  There is no pronator drift.  There are no fasciculations noted. DTR's: Deep tendon reflexes are 2/4 at the bilateral biceps, triceps, brachioradialis, patella and achilles.  Plantar responses are downgoing bilaterally. Gait and Station: The patient is able to ambulate without difficulty. The patient is able to heel toe walk without any difficulty. The patient is able to ambulate in a tandem fashion. The patient is able to stand in the Romberg position.   MOVEMENT EXAM: Tremor:  There is an irregular tremor of the arms and legs and even occasionally of the head.  He has it occasionally with rest and much worse with posture and intention.  He has significant difficulty with Archimedes spirals and can barely get the pen on the paper.  When asked to pour water from one glass to another, he spills water all over and gets frustrated with the task and gives up after only doing about half a glass.  Labs:  Lab Results  Component Value Date   TSH 3.320 07/19/2013   No results found for: [facility]    Chemistry      Component Value Date/Time   NA 141 10/20/2016 1235   K 4.5 10/20/2016 1235   CL 99 10/20/2016 1235   CO2 23 10/20/2016 1235   BUN 30 (H) 10/20/2016 1235   CREATININE 1.49 (H) 10/20/2016 1235      Component Value Date/Time   CALCIUM 9.5 10/20/2016 1235   ALKPHOS 51 10/20/2016 1235   AST 23 10/20/2016 1235   ALT 23 10/20/2016 1235   BILITOT 0.3 10/20/2016 1235           Assessment/Plan:   1.  Tremor  -I had a long  discussion with the patient and his wife.  Much greater than 50% of this 60 minute visit was spent in counseling with the patient and his wife.  I strongly suspect that his tremor is due to alcohol/alcohol withdrawal.  He is drinking a pint of hard liquor per day, and this is apparently less than he was drinking.  His tremor is irregular in nature, which would be somewhat unusual for essential tremor.  I talked to the patient and his wife about seeing someone in addiction medicine who can help him wean off of the alcohol, as he would not want to discontinue this on his own due to risk of seizure.  I also told the patient and his wife that tremor is likely not going to get much better with pure tremor medication such as primidone, although it may help slightly.  His wife asked about other medications, and I told her I would not recommend medication such as benzodiazepines in an alcoholic as the addiction potential is too high.  -I do think he needs additional lab work, including B12, folate, RPR, SPEP/UPEP, B1 and I will repeat his BMP given renal insufficiency.  2.  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  As above, lab work will be done.  We will call him with the results of these labs.  3.  I am happy to see him back in the future to deal with tremor, but think that the alcohol use needs to be dealt with first and then we can see if he has any remaining tremor left.  I asked him to discuss this with his wife after they left (she was present today) as well as his primary care physician.  CC:  Dr. Oswaldo DoneVincent

## 2016-11-22 ENCOUNTER — Encounter: Payer: Self-pay | Admitting: Neurology

## 2016-11-22 ENCOUNTER — Ambulatory Visit (INDEPENDENT_AMBULATORY_CARE_PROVIDER_SITE_OTHER): Payer: BLUE CROSS/BLUE SHIELD | Admitting: Neurology

## 2016-11-22 ENCOUNTER — Other Ambulatory Visit: Payer: BLUE CROSS/BLUE SHIELD

## 2016-11-22 VITALS — BP 146/80 | HR 97 | Ht 74.0 in | Wt 247.0 lb

## 2016-11-22 DIAGNOSIS — N289 Disorder of kidney and ureter, unspecified: Secondary | ICD-10-CM | POA: Diagnosis not present

## 2016-11-22 DIAGNOSIS — G609 Hereditary and idiopathic neuropathy, unspecified: Secondary | ICD-10-CM

## 2016-11-22 DIAGNOSIS — R251 Tremor, unspecified: Secondary | ICD-10-CM | POA: Diagnosis not present

## 2016-11-22 DIAGNOSIS — F1010 Alcohol abuse, uncomplicated: Secondary | ICD-10-CM

## 2016-11-22 LAB — BASIC METABOLIC PANEL
BUN: 22 mg/dL (ref 7–25)
CHLORIDE: 98 mmol/L (ref 98–110)
CO2: 21 mmol/L (ref 20–31)
CREATININE: 1.3 mg/dL (ref 0.70–1.33)
Calcium: 9.6 mg/dL (ref 8.6–10.3)
GLUCOSE: 100 mg/dL — AB (ref 65–99)
Potassium: 4.6 mmol/L (ref 3.5–5.3)
SODIUM: 138 mmol/L (ref 135–146)

## 2016-11-22 LAB — TSH: TSH: 2.23 mIU/L (ref 0.40–4.50)

## 2016-11-22 NOTE — Patient Instructions (Signed)
Your provider has requested that you have labwork completed today. Please go to [facility] ([id number]) on the second floor of this building before leaving the office today. You do not need to check in. If you are not called within 15 minutes please check with the front desk.   

## 2016-11-23 LAB — VITAMIN B12: VITAMIN B 12: 417 pg/mL (ref 200–1100)

## 2016-11-23 LAB — RPR

## 2016-11-23 LAB — FOLATE: Folate: 14 ng/mL (ref >5.4)

## 2016-11-24 LAB — IMMUNOFIXATION ELECTROPHORESIS
IGG (IMMUNOGLOBIN G), SERUM: 953 mg/dL (ref 694–1618)
IgA: 165 mg/dL (ref 81–463)
IgM, Serum: 97 mg/dL (ref 48–271)

## 2016-11-24 LAB — PROTEIN ELECTROPHORESIS, SERUM
ALBUMIN ELP: 4.5 g/dL (ref 3.8–4.8)
ALPHA-2-GLOBULIN: 0.7 g/dL (ref 0.5–0.9)
Alpha-1-Globulin: 0.3 g/dL (ref 0.2–0.3)
BETA GLOBULIN: 0.4 g/dL (ref 0.4–0.6)
Beta 2: 0.3 g/dL (ref 0.2–0.5)
Gamma Globulin: 0.9 g/dL (ref 0.8–1.7)
Total Protein, Serum Electrophoresis: 7.1 g/dL (ref 6.1–8.1)

## 2016-11-24 LAB — PROTEIN ELECTROPHORESIS,RANDOM URN
ALBUMIN UR 24 HR ELECTRO: 26.5 %
Alpha-1-Globulin, U: 41 %
Alpha-2-Globulin, U: 13.8 %
Beta Globulin, U: 11.4 %
Creatinine, Urine: 290 mg/dL (ref 20–370)
GAMMA GLOBULIN, U: 7.3 %
Protein Creatinine Ratio: 100 mg/g creat (ref 22–128)
Total Protein, Urine: 29 mg/dL — ABNORMAL HIGH (ref 5–25)

## 2016-11-25 LAB — VITAMIN B1: Vitamin B1 (Thiamine): <7 nmol/L — ABNORMAL LOW (ref 8–30)

## 2016-11-29 ENCOUNTER — Telehealth: Payer: Self-pay | Admitting: Neurology

## 2016-11-29 LAB — IMMUNOFIXATION INTE

## 2016-11-29 NOTE — Telephone Encounter (Signed)
Spoke with patient's wife. She states patient had been weaning off alcohol since the end of December. Made her aware he would need to be off alcohol for a few months before she would consider medication. They will call if needed.

## 2016-11-29 NOTE — Telephone Encounter (Signed)
If he got off alcohol on his own, that was WAY too fast and I would be concerned about w/d seizure.  He needs to do this under supervision of PCP or addiction specialist.  Tremor will last a long time (perhaps a few months) once alcohol d/c under supervision.  Would not use medication until then as I really think that most is EtOH induced

## 2016-11-29 NOTE — Telephone Encounter (Signed)
Spoke with patient's wife and made her aware.   She wants to know about him starting a prescription for tremor. Reading his last office note - I advised that it sounded like Dr. Arbutus Leasat wanted him to wean off alcohol prior to starting medication. She states he has already weaned off alcohol- which would be very fast since his appt was only 7 days ago.   Dr. Arbutus Leasat please advise.

## 2016-11-29 NOTE — Telephone Encounter (Signed)
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  Needs to start vitamin B1 (thiamine) 100 mg daily

## 2016-12-31 ENCOUNTER — Other Ambulatory Visit: Payer: Self-pay | Admitting: Family Medicine

## 2017-01-03 ENCOUNTER — Other Ambulatory Visit: Payer: Self-pay | Admitting: Family Medicine

## 2017-11-04 ENCOUNTER — Telehealth: Payer: Self-pay | Admitting: *Deleted

## 2017-11-04 NOTE — Telephone Encounter (Signed)
Patient having side effects with blood pressure medications since change of manufacturer.  Pharmacy has old manufacturer medicine back in stock so [facility]

## 2017-11-16 ENCOUNTER — Encounter: Payer: Self-pay | Admitting: Pediatrics

## 2017-11-16 ENCOUNTER — Ambulatory Visit: Payer: BLUE CROSS/BLUE SHIELD | Admitting: Pediatrics

## 2017-11-16 VITALS — BP 92/65 | HR 95 | Temp 97.9°F | Resp 18 | Ht 74.0 in | Wt 238.4 lb

## 2017-11-16 DIAGNOSIS — M109 Gout, unspecified: Secondary | ICD-10-CM | POA: Diagnosis not present

## 2017-11-16 DIAGNOSIS — R55 Syncope and collapse: Secondary | ICD-10-CM | POA: Diagnosis not present

## 2017-11-16 DIAGNOSIS — I10 Essential (primary) hypertension: Secondary | ICD-10-CM | POA: Diagnosis not present

## 2017-11-16 DIAGNOSIS — Z789 Other specified health status: Secondary | ICD-10-CM | POA: Diagnosis not present

## 2017-11-16 DIAGNOSIS — Z7289 Other problems related to lifestyle: Secondary | ICD-10-CM

## 2017-11-16 NOTE — Patient Instructions (Addendum)
Stop blood pressure medicine  Take blood pressure every day for the next two weeks, write numbers down and bring to next clinic visit  If feeling worse let us know

## 2017-11-16 NOTE — Progress Notes (Signed)
  Subjective:   Patient ID: Anthony Chavez, male    DOB: [birth year], 57 y.o.   MRN: [id number] CC: low oxygen and low bp  HPI: ALERIC FROELICH is a 57 y.o. male presenting for low oxygen and low bp  Episode at work 2 weeks ago, got lightheaded, had to call EMS BP was "low" initially He says he "felt like he was dying", felt like he was turning gray Felt better when he sat/lay down Similar event happened last night, EMS called to the house. SBP was 90s when EMS left, initially lower per son O2 sat was 86% per paperwork at first check, then 90s next check per paperwork  No cough, no SOB, no trouble breathing No chest pain  HTN: checking past 2 weeks, 90s-115 is highest SBP he can remember Has been taking his BP med (combo ARB/amlodipine/HCTZ) every day until yesterday EMS told him not to take it until he sees PCP  Drinking 3-4 shots of whiskey every night  Tremors: come and go. No worse. Says today is a pretty good day.  Brought his son with him today bc he didn't feel safe driving due to feeling tired No seizure-like activity  Relevant past medical, surgical, family and social history reviewed. Allergies and medications reviewed and updated. Social History   Tobacco Use  Smoking Status Never Smoker  Smokeless Tobacco Never Used   ROS: Per HPI   Objective:    BP 92/65   Pulse 95   Temp 97.9 F (36.6 C) (Oral)   Resp 18   Ht '6\' 2"'$  (1.88 m)   Wt 238 lb 6.4 oz (108.1 kg)   SpO2 99%   BMI 30.61 kg/m   Wt Readings from Last 3 Encounters:  11/16/17 238 lb 6.4 oz (108.1 kg)  11/22/16 247 lb (112 kg)  10/20/16 248 lb 3.2 oz (112.6 kg)    Gen: NAD, tired appearing, alert, cooperative with exam, NCAT EYES: EOMI, no conjunctival injection, or no icterus ENT:  TMs pearly gray b/l, OP without erythema LYMPH: no cervical LAD CV: NRRR, normal S1/S2, no murmur, distal pulses 2+ b/l Resp: CTABL, no wheezes, normal WOB Abd: +BS, soft, NTND. no guarding or organomegaly Ext:  No edema, warm Neuro: Alert and oriented, strength equal b/l UE and LE, coordination grossly normal MSK: normal muscle bulk  Assessment & Plan:  Fischer was seen today for low oxygen and low bp.  Diagnoses and all orders for this visit:  Syncope, unspecified syncope type Suspect due to very low BPs from over medication with BP med, decreased oxygen episode per pt I am not sure how it relates Normal lung exam now, normal O2 sats today No h/o seizure like activity No fevers, no systemic illness other than feeling very tired that I think could be related to low BP Will get labs Pt to STOP combination blood pressure medicine Check blood pressures at home daily Any worsening symptoms, altered mental status needs to be in ED -     CMP14+EGFR -     CBC with Differential/Platelet  Essential hypertension, benign Stop BP med to to hypotension  Gout, unspecified cause, unspecified chronicity, unspecified site Cont allopurinol  Alcohol use Daily drinking, will continue to encourage decrease and cessation  Follow up plan: Return in about 2 weeks (around 11/30/2017). Assunta Found, MD [place]

## 2017-11-17 ENCOUNTER — Telehealth: Payer: Self-pay | Admitting: Family Medicine

## 2017-11-17 ENCOUNTER — Encounter: Payer: Self-pay | Admitting: Pediatrics

## 2017-11-17 LAB — CBC WITH DIFFERENTIAL/PLATELET
BASOS ABS: 0 10*3/uL (ref 0.0–0.2)
Basos: 1 %
EOS (ABSOLUTE): 0 10*3/uL (ref 0.0–0.4)
Eos: 1 %
Hematocrit: 40.7 % (ref 37.5–51.0)
Hemoglobin: 13.3 g/dL (ref 13.0–17.7)
IMMATURE GRANULOCYTES: 0 %
Immature Grans (Abs): 0 10*3/uL (ref 0.0–0.1)
LYMPHS ABS: 1.5 10*3/uL (ref 0.7–3.1)
Lymphs: 29 %
MCH: 34.6 pg — ABNORMAL HIGH (ref 26.6–33.0)
MCHC: 32.7 g/dL (ref 31.5–35.7)
MCV: 106 fL — ABNORMAL HIGH (ref 79–97)
MONOS ABS: 0.8 10*3/uL (ref 0.1–0.9)
Monocytes: 16 %
NEUTROS PCT: 53 %
Neutrophils Absolute: 2.9 10*3/uL (ref 1.4–7.0)
PLATELETS: 187 10*3/uL (ref 150–379)
RBC: 3.84 x10E6/uL — ABNORMAL LOW (ref 4.14–5.80)
RDW: 14.8 % (ref 12.3–15.4)
WBC: 5.3 10*3/uL (ref 3.4–10.8)

## 2017-11-17 LAB — CMP14+EGFR
ALBUMIN: 4.7 g/dL (ref 3.5–5.5)
ALK PHOS: 76 IU/L (ref 39–117)
ALT: 42 IU/L (ref 0–44)
AST: 53 IU/L — AB (ref 0–40)
Albumin/Globulin Ratio: 2 (ref 1.2–2.2)
BILIRUBIN TOTAL: 0.7 mg/dL (ref 0.0–1.2)
BUN / CREAT RATIO: 20 (ref 9–20)
BUN: 53 mg/dL — AB (ref 6–24)
CHLORIDE: 97 mmol/L (ref 96–106)
CO2: 18 mmol/L — AB (ref 20–29)
CREATININE: 2.61 mg/dL — AB (ref 0.76–1.27)
Calcium: 9.5 mg/dL (ref 8.7–10.2)
GFR calc Af Amer: 31 mL/min/{1.73_m2} — ABNORMAL LOW (ref >59)
GFR calc non Af Amer: 26 mL/min/{1.73_m2} — ABNORMAL LOW (ref >59)
GLOBULIN, TOTAL: 2.3 g/dL (ref 1.5–4.5)
GLUCOSE: 85 mg/dL (ref 65–99)
Potassium: 4.9 mmol/L (ref 3.5–5.2)
SODIUM: 142 mmol/L (ref 134–144)
Total Protein: 7 g/dL (ref 6.0–8.5)

## 2017-11-17 NOTE — Telephone Encounter (Signed)
Pt notified of lab results and recommendation Will come in tomorrow for repeat labs and BP check Will do to ER if sxs worsen

## 2017-11-18 ENCOUNTER — Other Ambulatory Visit: Payer: BLUE CROSS/BLUE SHIELD

## 2017-11-18 ENCOUNTER — Other Ambulatory Visit: Payer: Self-pay | Admitting: *Deleted

## 2017-11-18 ENCOUNTER — Encounter (INDEPENDENT_AMBULATORY_CARE_PROVIDER_SITE_OTHER): Payer: BLUE CROSS/BLUE SHIELD

## 2017-11-18 DIAGNOSIS — R7989 Other specified abnormal findings of blood chemistry: Secondary | ICD-10-CM

## 2017-11-19 ENCOUNTER — Other Ambulatory Visit: Payer: Self-pay | Admitting: Pediatrics

## 2017-11-19 DIAGNOSIS — R740 Nonspecific elevation of levels of transaminase and lactic acid dehydrogenase [LDH]: Principal | ICD-10-CM

## 2017-11-19 DIAGNOSIS — R7401 Elevation of levels of liver transaminase levels: Secondary | ICD-10-CM

## 2017-11-19 LAB — CMP14+EGFR
A/G RATIO: 1.8 (ref 1.2–2.2)
ALBUMIN: 4.5 g/dL (ref 3.5–5.5)
ALT: 49 IU/L — ABNORMAL HIGH (ref 0–44)
AST: 104 IU/L — ABNORMAL HIGH (ref 0–40)
Alkaline Phosphatase: 70 IU/L (ref 39–117)
BILIRUBIN TOTAL: 1 mg/dL (ref 0.0–1.2)
BUN / CREAT RATIO: 23 — AB (ref 9–20)
BUN: 38 mg/dL — ABNORMAL HIGH (ref 6–24)
CALCIUM: 9.3 mg/dL (ref 8.7–10.2)
CHLORIDE: 96 mmol/L (ref 96–106)
CO2: 18 mmol/L — ABNORMAL LOW (ref 20–29)
Creatinine, Ser: 1.64 mg/dL — ABNORMAL HIGH (ref 0.76–1.27)
GFR calc Af Amer: 54 mL/min/{1.73_m2} — ABNORMAL LOW (ref >59)
GFR, EST NON AFRICAN AMERICAN: 46 mL/min/{1.73_m2} — AB (ref >59)
GLOBULIN, TOTAL: 2.5 g/dL (ref 1.5–4.5)
Glucose: 111 mg/dL — ABNORMAL HIGH (ref 65–99)
POTASSIUM: 4.4 mmol/L (ref 3.5–5.2)
Sodium: 138 mmol/L (ref 134–144)
Total Protein: 7 g/dL (ref 6.0–8.5)

## 2017-11-19 LAB — CBC WITH DIFFERENTIAL/PLATELET
Basophils Absolute: 0 10*3/uL (ref 0.0–0.2)
Basos: 1 %
EOS (ABSOLUTE): 0.1 10*3/uL (ref 0.0–0.4)
EOS: 1 %
HEMATOCRIT: 38.5 % (ref 37.5–51.0)
Hemoglobin: 12.9 g/dL — ABNORMAL LOW (ref 13.0–17.7)
IMMATURE GRANS (ABS): 0 10*3/uL (ref 0.0–0.1)
Immature Granulocytes: 0 %
LYMPHS: 40 %
Lymphocytes Absolute: 1.7 10*3/uL (ref 0.7–3.1)
MCH: 35.1 pg — ABNORMAL HIGH (ref 26.6–33.0)
MCHC: 33.5 g/dL (ref 31.5–35.7)
MCV: 105 fL — ABNORMAL HIGH (ref 79–97)
MONOCYTES: 14 %
MONOS ABS: 0.6 10*3/uL (ref 0.1–0.9)
NEUTROS PCT: 44 %
Neutrophils Absolute: 1.8 10*3/uL (ref 1.4–7.0)
Platelets: 182 10*3/uL (ref 150–379)
RBC: 3.68 x10E6/uL — AB (ref 4.14–5.80)
RDW: 14 % (ref 12.3–15.4)
WBC: 4.2 10*3/uL (ref 3.4–10.8)

## 2017-11-21 ENCOUNTER — Telehealth: Payer: Self-pay | Admitting: Family Medicine

## 2017-11-21 NOTE — Telephone Encounter (Signed)
Pt just reported that US is 10 Thursday am and appt here is at 4 -- she is aware that timing should be fine and report should be back by appt

## 2017-11-22 ENCOUNTER — Telehealth: Payer: Self-pay | Admitting: Pediatrics

## 2017-11-22 NOTE — Telephone Encounter (Signed)
Spoke with Anthony Chavez's wife regarding BP Anthony Chavez's BP 124/83 this AM at home  Anthony Chavez went to work After being at work about 15 minutes, Anthony Chavez became very nervous and shaky Anthony Chavez was taken to nurse BP was 180/122 BP stabilized after about 15 minutes to 130/72 Anthony Chavez will continue to monitor BP Anthony Chavez will come in on Thursday for follow up with Dr Oswaldo DoneVincent Will call sooner if sxs worsen or persist

## 2017-11-23 ENCOUNTER — Other Ambulatory Visit: Payer: BLUE CROSS/BLUE SHIELD

## 2017-11-23 NOTE — Telephone Encounter (Signed)
Noted, thanks!

## 2017-11-24 ENCOUNTER — Ambulatory Visit (HOSPITAL_COMMUNITY): Payer: BLUE CROSS/BLUE SHIELD

## 2017-11-24 ENCOUNTER — Telehealth: Payer: Self-pay | Admitting: Pediatrics

## 2017-11-24 ENCOUNTER — Telehealth: Payer: Self-pay | Admitting: *Deleted

## 2017-11-24 ENCOUNTER — Ambulatory Visit
Admission: RE | Admit: 2017-11-24 | Discharge: 2017-11-24 | Disposition: A | Discharge status: Home / Self Care | Payer: BLUE CROSS/BLUE SHIELD | Source: Ambulatory Visit | Attending: Pediatrics | Admitting: Pediatrics

## 2017-11-24 ENCOUNTER — Ambulatory Visit: Payer: BLUE CROSS/BLUE SHIELD | Admitting: Pediatrics

## 2017-11-24 DIAGNOSIS — R0602 Shortness of breath: Secondary | ICD-10-CM | POA: Diagnosis not present

## 2017-11-24 DIAGNOSIS — R945 Abnormal results of liver function studies: Secondary | ICD-10-CM | POA: Diagnosis not present

## 2017-11-24 DIAGNOSIS — R1110 Vomiting, unspecified: Secondary | ICD-10-CM | POA: Diagnosis not present

## 2017-11-24 DIAGNOSIS — R7401 Elevation of levels of liver transaminase levels: Secondary | ICD-10-CM

## 2017-11-24 DIAGNOSIS — R748 Abnormal levels of other serum enzymes: Secondary | ICD-10-CM | POA: Diagnosis not present

## 2017-11-24 DIAGNOSIS — R740 Nonspecific elevation of levels of transaminase and lactic acid dehydrogenase [LDH]: Secondary | ICD-10-CM | POA: Diagnosis not present

## 2017-11-24 DIAGNOSIS — Z7289 Other problems related to lifestyle: Secondary | ICD-10-CM | POA: Diagnosis not present

## 2017-11-24 DIAGNOSIS — R000 Tachycardia, unspecified: Secondary | ICD-10-CM | POA: Diagnosis not present

## 2017-11-24 DIAGNOSIS — R7989 Other specified abnormal findings of blood chemistry: Secondary | ICD-10-CM | POA: Diagnosis not present

## 2017-11-24 DIAGNOSIS — I10 Essential (primary) hypertension: Secondary | ICD-10-CM | POA: Diagnosis not present

## 2017-11-24 DIAGNOSIS — R531 Weakness: Secondary | ICD-10-CM | POA: Diagnosis not present

## 2017-11-24 DIAGNOSIS — R42 Dizziness and giddiness: Secondary | ICD-10-CM | POA: Diagnosis not present

## 2017-11-24 DIAGNOSIS — R251 Tremor, unspecified: Secondary | ICD-10-CM | POA: Diagnosis not present

## 2017-11-24 NOTE — Telephone Encounter (Signed)
Spoke with patient's wife.  Patient's wife states that patient is having a hard time breathing and is shaking.  Wife will take patient to ED.

## 2017-11-25 ENCOUNTER — Telehealth: Payer: Self-pay | Admitting: Family Medicine

## 2017-11-25 ENCOUNTER — Ambulatory Visit: Payer: BLUE CROSS/BLUE SHIELD | Admitting: Pediatrics

## 2017-11-25 ENCOUNTER — Ambulatory Visit: Payer: Self-pay | Admitting: Family

## 2017-11-25 ENCOUNTER — Encounter: Payer: Self-pay | Admitting: Pediatrics

## 2017-11-25 VITALS — BP 138/87 | HR 86 | Temp 97.1°F | Resp 20 | Ht 74.0 in | Wt 241.4 lb

## 2017-11-25 DIAGNOSIS — F419 Anxiety disorder, unspecified: Secondary | ICD-10-CM

## 2017-11-25 DIAGNOSIS — Z789 Other specified health status: Secondary | ICD-10-CM

## 2017-11-25 DIAGNOSIS — Z7289 Other problems related to lifestyle: Secondary | ICD-10-CM

## 2017-11-25 DIAGNOSIS — F1090 Alcohol use, unspecified, uncomplicated: Secondary | ICD-10-CM

## 2017-11-25 DIAGNOSIS — F10230 Alcohol dependence with withdrawal, uncomplicated: Secondary | ICD-10-CM | POA: Diagnosis not present

## 2017-11-25 DIAGNOSIS — F10930 Alcohol use, unspecified with withdrawal, uncomplicated: Secondary | ICD-10-CM

## 2017-11-25 MED ORDER — ONDANSETRON 4 MG PO TBDP: 4.0000 mg | ORAL_TABLET | Freq: Two times a day (BID) | ORAL | 0 refills | Status: DC | PRN

## 2017-11-25 MED ORDER — CHLORDIAZEPOXIDE HCL 25 MG PO CAPS: ORAL_CAPSULE | ORAL | 0 refills | Status: DC

## 2017-11-25 NOTE — Progress Notes (Signed)
  Subjective:   Patient ID: Anthony Chavez, male    DOB: 06/01/1961, 57 y.o.   MRN: [id number] CC: Hospitalization Follow-uPaula Comptonp  HPI: Paula ComptonMark S Chavez is a 57 y.o. male presenting for Hospitalization Follow-up  Here today with his wife  Seen in ED last night for SOB, tremors, nausea during abd u/s for elevated LFTs BP elevated when he feels very anxious Was given ativan x 2 doses per pt helped with symptoms some Feeling better today Tremors improved Still nauseous No vomiting today  Last EtOH was Monday night, 3.5 days ago He is not planning on stopping drinking now He has had nausea in the past when he has stopped alcohol, last tried 6 mo ago on his own No h/o DTs, seizures, hallucinations  Ongoing anxiety Lots of stress at work, wife with tumor this past year Drinks daily usually, 3-4 shots of whiskey Open to treatment of anxiety in future Does not want counseling of treatment for alcohol use now, planning to restart drinking  Wife says he often drinks to calm down, especially in social situations  Does not feel SOB, no CP  Relevant past medical, surgical, family and social history reviewed. Allergies and medications reviewed and updated. Social History   Tobacco Use  Smoking Status Never Smoker  Smokeless Tobacco Never Used   ROS: Per HPI   Objective:    BP 138/87   Pulse 86   Temp (!) 97.1 F (36.2 C) (Oral)   Resp 20   Ht 6\' 2"  (1.88 m)   Wt 241 lb 6.4 oz (109.5 kg)   SpO2 96%   BMI 30.99 kg/m   Wt Readings from Last 3 Encounters:  11/25/17 241 lb 6.4 oz (109.5 kg)  11/16/17 238 lb 6.4 oz (108.1 kg)  11/22/16 247 lb (112 kg)    Gen: NAD, alert, cooperative with exam, NCAT EYES: EOMI, no conjunctival injection, or no icterus ENT:  OP without erythema LYMPH: no cervical LAD CV: NRRR, normal S1/S2 Resp: CTAB, normal WOB Abd: +BS, soft, NTND. no guarding or organomegaly Ext: No edema, warm Neuro: Alert and oriented, mild tremor present in out stretched  hands, no tremor at rest  Assessment & Plan:  Anthony LericheMark was seen today for hospitalization follow-up.  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 (HCC) isnt sure when he will restart Not planning on continuing abstinence CIWA 15, any elevated BPs, worsening symptoms, needs to be seen immediately, pt and wife aware Rx for below given, pt to follow up on Monday, 3 days, sooner if need -     chlordiazePOXIDE (LIBRIUM) 25 MG capsule; Take BID for two days, then daily until symptoms improve  Alcohol use Continue to encourage decrease and cessation, pt not ready now  Anxiety Consider treatment in future, pt open  Other orders -     ondansetron (ZOFRAN-ODT) 4 MG disintegrating tablet; Take 1 tablet (4 mg total) by mouth every 12 (twelve) hours as needed for nausea or vomiting.  I spent 35 minutes with the patient with over 50% of the encounter time dedicated to counseling on the above problems.   Follow up plan: Return in about 3 days (around 11/28/2017). Anthony Krasarol Vincent, MD [facility]

## 2017-11-25 NOTE — Telephone Encounter (Signed)
Called and spoke with pt, coming in today for appt as well

## 2017-11-25 NOTE — Telephone Encounter (Signed)
Apt made today with [facility] at 3:10.

## 2017-11-28 ENCOUNTER — Encounter: Payer: Self-pay | Admitting: Pediatrics

## 2017-11-28 ENCOUNTER — Ambulatory Visit: Payer: BLUE CROSS/BLUE SHIELD | Admitting: Pediatrics

## 2017-11-28 VITALS — BP 124/83 | HR 93 | Temp 97.1°F | Ht 74.0 in | Wt 240.2 lb

## 2017-11-28 DIAGNOSIS — Z789 Other specified health status: Secondary | ICD-10-CM

## 2017-11-28 DIAGNOSIS — I10 Essential (primary) hypertension: Secondary | ICD-10-CM | POA: Diagnosis not present

## 2017-11-28 DIAGNOSIS — R251 Tremor, unspecified: Secondary | ICD-10-CM

## 2017-11-28 DIAGNOSIS — F419 Anxiety disorder, unspecified: Secondary | ICD-10-CM | POA: Diagnosis not present

## 2017-11-28 DIAGNOSIS — Z7289 Other problems related to lifestyle: Secondary | ICD-10-CM

## 2017-11-28 DIAGNOSIS — R2681 Unsteadiness on feet: Secondary | ICD-10-CM | POA: Diagnosis not present

## 2017-11-28 NOTE — Progress Notes (Signed)
  Subjective:   Patient ID: Anthony Chavez, male    DOB: 07/26/1961, 57 y.o.   MRN: [id number] CC: Follow-up (librium 'knocks me out')  HPI: Anthony Chavez is a 57 y.o. male presenting for Follow-up (librium 'knocks me out')  Eating and drinking fairly normally  Drinking EtOH daily since last visit but less, says he wants to cut back Has been told in the past would not be a good idea to stop all of a sudden  Feeling more normal self Needs note for work  BPs at home he reports as normal, 120s/70s  Took librium twice, slept for 3-4 hours each time  No lightheadedness or dizziness past few days For past two weeks has had some unsteadiness of gait off and on, new since January Tremor is improved. Has had some tremor for months. When nervous tremor gets worse.  Anxiety: improved Still with some symptoms, says better since last visit Not interested in counseling  Relevant past medical, surgical, family and social history reviewed. Allergies and medications reviewed and updated. Social History   Tobacco Use  Smoking Status Never Smoker  Smokeless Tobacco Never Used   ROS: Per HPI   Objective:    BP 124/83   Pulse 93   Temp (!) 97.1 F (36.2 C) (Oral)   Ht 6\' 2"  (1.88 m)   Wt 240 lb 3.2 oz (109 kg)   BMI 30.84 kg/m   Wt Readings from Last 3 Encounters:  11/28/17 240 lb 3.2 oz (109 kg)  11/25/17 241 lb 6.4 oz (109.5 kg)  11/16/17 238 lb 6.4 oz (108.1 kg)    Gen: NAD, alert, cooperative with exam, NCAT EYES: EOMI, no conjunctival injection, or no icterus ENT: OP without erythema CV: NRRR, normal S1/S2, no murmur, distal pulses 2+ b/l Resp: CTABL, no wheezes, normal WOB Abd: +BS, soft, NTND. no guarding or organomegaly Ext: No edema, warm Neuro: Alert and oriented, strength equal b/l UE and LE, coordination grossly normal, patellar reflex 1+ b/l MSK: normal muscle bulk  Assessment & Plan:  Anthony Chavez was seen today for follow-up med problems.  Diagnoses and all orders  for this visit:  Essential hypertension, benign wnl off of meds.  Alcohol use Ongoing, pt wants to cut back, says he does not want to stop. Discussed ongoing liver damage with continued drinking. Will recheck LFTs next visit Offered outpt treatment program or counseling, declined.  Tremor Improved since restarting EtOH Cont to strongly encourage cessation of EtOH  Follow up plan: Return in about 6 weeks (around 01/09/2018).  Sooner if needed Rex Krasarol Zalan Shidler, MD [facility]

## 2017-12-08 ENCOUNTER — Encounter: Payer: Self-pay | Admitting: Pediatrics

## 2017-12-14 ENCOUNTER — Telehealth: Payer: Self-pay | Admitting: Pediatrics

## 2017-12-14 DIAGNOSIS — I10 Essential (primary) hypertension: Secondary | ICD-10-CM

## 2017-12-14 MED ORDER — AMLODIPINE BESYLATE 5 MG PO TABS: 5.0000 mg | ORAL_TABLET | Freq: Every day | ORAL | 0 refills | Status: DC

## 2017-12-14 NOTE — Telephone Encounter (Signed)
Spoke with Anthony Chavez and his wife by phone.  Blood pressures for the last week have been 160s up to 170s systolic.  He says he gets nervous when he gets up that high and will start shaking more, sometimes feel nauseous.  No headaches, no chest pain he has been drinking alcohol every night.  Not decreasing amount right now.  Does not think he could be withdrawing from EtOH.  Right now he is not taking any blood pressure medicine after being on a combination medicine with 3 meds and having low blood pressures.  Will restart daily amlodipine 5 mg, sent into his pharmacy.  If on Friday blood pressures are still persistently over 140 systolic, patient is going to start taking 10 mg.  He will call back on Monday with what his blood pressures have been.

## 2018-01-10 ENCOUNTER — Ambulatory Visit: Payer: BLUE CROSS/BLUE SHIELD | Admitting: Pediatrics

## 2018-01-16 ENCOUNTER — Encounter: Payer: Self-pay | Admitting: Pediatrics

## 2018-01-16 ENCOUNTER — Ambulatory Visit: Payer: BLUE CROSS/BLUE SHIELD | Admitting: Pediatrics

## 2018-01-16 VITALS — BP 136/80 | HR 87 | Temp 97.9°F | Ht 74.0 in | Wt 249.2 lb

## 2018-01-16 DIAGNOSIS — M7989 Other specified soft tissue disorders: Secondary | ICD-10-CM | POA: Diagnosis not present

## 2018-01-16 DIAGNOSIS — R200 Anesthesia of skin: Secondary | ICD-10-CM

## 2018-01-16 DIAGNOSIS — I10 Essential (primary) hypertension: Secondary | ICD-10-CM

## 2018-01-16 DIAGNOSIS — R202 Paresthesia of skin: Secondary | ICD-10-CM

## 2018-01-16 DIAGNOSIS — M109 Gout, unspecified: Secondary | ICD-10-CM | POA: Diagnosis not present

## 2018-01-16 MED ORDER — AMLODIPINE BESYLATE 10 MG PO TABS: 10.0000 mg | ORAL_TABLET | Freq: Every day | ORAL | 3 refills | Status: DC

## 2018-01-16 MED ORDER — FUROSEMIDE 20 MG PO TABS: 20.0000 mg | ORAL_TABLET | Freq: Every day | ORAL | 3 refills | Status: DC

## 2018-01-16 NOTE — Patient Instructions (Signed)
Taking multivitamin every day.  Continue to check blood pressures at home. Let me know if regularly >140 on top or > 90 on bottom.

## 2018-01-16 NOTE — Progress Notes (Signed)
Subjective:   Patient ID: Anthony Chavez, male    DOB: 09/24/1961, 57 y.o.   MRN: [id number] CC: Follow-up (6 week) Multiple medical problems HPI: Anthony Chavez is a 57 y.o. male presenting for Follow-up (6 week)  HTN: 130-140s/80s at home.  Has been taking 10 mg of amlodipine for the past 3 weeks.  Before that on 5 mg of amlodipine blood pressures regularly up to the 242A systolic.  Has felt better since getting blood pressures lower.  Continues to drink 2-3 two fingers widths of hard liquor every evening.  He starts getting more shaky by 24 hours after his last drink.  He only drinks around midnight when he gets off of work.  Currently working 2 PM to midnight.  He is open to cutting back.  He does not want any medical assistance with detox referrals right now.    Left hand fourth and fifth fingers have felt numb, tingly for the last 6 weeks.  He thinks might be slightly better, has not really changed.  Has not gotten any worse.  No pain.  Does not think that he rests his elbow on anything to cause compression on the ulnar nerve.  Gout: Last flare was years ago.  His feet were affected.  He was not able to walk.  He has been taking allopurinol regularly.   Relevant past medical, surgical, family and social history reviewed. Allergies and medications reviewed and updated. Social History   Tobacco Use  Smoking Status Never Smoker  Smokeless Tobacco Never Used   ROS: Per HPI   Objective:    BP 136/80   Pulse 87   Temp 97.9 F (36.6 C) (Oral)   Ht 6' 2"  (1.88 m)   Wt 249 lb 3.2 oz (113 kg)   BMI 32.00 kg/m   Wt Readings from Last 3 Encounters:  01/16/18 249 lb 3.2 oz (113 kg)  11/28/17 240 lb 3.2 oz (109 kg)  11/25/17 241 lb 6.4 oz (109.5 kg)    Gen: NAD, alert, cooperative with exam, NCAT EYES: EOMI, no conjunctival injection, or no icterus ENT:   OP without erythema LYMPH: no cervical LAD CV: NRRR, normal S1/S2, no murmur, distal pulses 2+ b/l Resp: CTABL, no  wheezes, normal WOB Ext: 1-2+ pitting edema, bilateral lower extremities up to mid shin, warm Neuro: Alert and oriented, strength equal b/l UE and LE, coordination grossly normal, intention tremor present.  Assessment & Plan:  Joneric was seen today for follow-up.  Diagnoses and all orders for this visit:  Leg swelling Start below.  Recheck blood work including kidney function today.  Follow-up in 2 months. -     furosemide (LASIX) 20 MG tablet; Take 1 tablet (20 mg total) by mouth daily. -     CMP14+EGFR  Essential hypertension Continue below.  Patient is going to check at home.  If blood pressures regularly elevated at home let me know. -     amLODipine (NORVASC) 10 MG tablet; Take 1 tablet (10 mg total) by mouth daily. -     CMP14+EGFR  Gout, unspecified cause, unspecified chronicity, unspecified site Will recheck uric acid level.  -     Uric Acid -     CMP14+EGFR  Numbness and tingling in left hand  Patient remembers some of numb, odd feeling with last gout flare.  Doubt gout flare.  Possible ulnar nerve impingement?  Refer to neurology.  Alcohol use Cessation encouraged.  Patient not interested right now.  Given elevated blood  pressures earlier this year when he tried stopping alcohol at home, recommend medical detox ideally in a hospital if he does decide to stop drinking.  Follow up plan: Return in about 2 months (around 03/18/2018). Assunta Found, MD [place]

## 2018-01-17 ENCOUNTER — Other Ambulatory Visit: Payer: Self-pay | Admitting: Pediatrics

## 2018-01-17 ENCOUNTER — Other Ambulatory Visit: Payer: Self-pay

## 2018-01-17 DIAGNOSIS — E876 Hypokalemia: Secondary | ICD-10-CM

## 2018-01-17 LAB — CMP14+EGFR
A/G RATIO: 1.5 (ref 1.2–2.2)
ALBUMIN: 4.1 g/dL (ref 3.5–5.5)
ALT: 44 IU/L (ref 0–44)
AST: 79 IU/L — ABNORMAL HIGH (ref 0–40)
Alkaline Phosphatase: 89 IU/L (ref 39–117)
BILIRUBIN TOTAL: 0.7 mg/dL (ref 0.0–1.2)
BUN / CREAT RATIO: 9 (ref 9–20)
BUN: 8 mg/dL (ref 6–24)
CALCIUM: 8.9 mg/dL (ref 8.7–10.2)
CHLORIDE: 99 mmol/L (ref 96–106)
CO2: 25 mmol/L (ref 20–29)
Creatinine, Ser: 0.94 mg/dL (ref 0.76–1.27)
GFR, EST AFRICAN AMERICAN: 105 mL/min/{1.73_m2} (ref >59)
GFR, EST NON AFRICAN AMERICAN: 91 mL/min/{1.73_m2} (ref >59)
Globulin, Total: 2.8 g/dL (ref 1.5–4.5)
Glucose: 103 mg/dL — ABNORMAL HIGH (ref 65–99)
POTASSIUM: 2.9 mmol/L — AB (ref 3.5–5.2)
SODIUM: 144 mmol/L (ref 134–144)
TOTAL PROTEIN: 6.9 g/dL (ref 6.0–8.5)

## 2018-01-17 LAB — URIC ACID: Uric Acid: 5.1 mg/dL (ref 3.7–8.6)

## 2018-01-17 MED ORDER — POTASSIUM CHLORIDE CRYS ER 20 MEQ PO TBCR: 20.0000 meq | EXTENDED_RELEASE_TABLET | Freq: Every day | ORAL | 3 refills | Status: DC

## 2018-01-30 ENCOUNTER — Other Ambulatory Visit: Payer: Self-pay | Admitting: Family Medicine

## 2018-03-07 ENCOUNTER — Other Ambulatory Visit: Payer: Self-pay | Admitting: Pediatrics

## 2018-03-07 DIAGNOSIS — I10 Essential (primary) hypertension: Secondary | ICD-10-CM

## 2018-03-20 ENCOUNTER — Ambulatory Visit: Payer: BLUE CROSS/BLUE SHIELD | Admitting: Pediatrics

## 2018-05-01 ENCOUNTER — Other Ambulatory Visit: Payer: Self-pay | Admitting: Family Medicine

## 2018-05-01 ENCOUNTER — Ambulatory Visit: Payer: BLUE CROSS/BLUE SHIELD | Admitting: Family Medicine

## 2018-05-01 ENCOUNTER — Encounter: Payer: Self-pay | Admitting: Family Medicine

## 2018-05-01 VITALS — BP 129/64 | HR 79 | Temp 97.9°F | Ht 74.0 in | Wt 247.4 lb

## 2018-05-01 DIAGNOSIS — L989 Disorder of the skin and subcutaneous tissue, unspecified: Secondary | ICD-10-CM

## 2018-05-01 MED ORDER — CEPHALEXIN 500 MG PO CAPS: 500.0000 mg | ORAL_CAPSULE | Freq: Three times a day (TID) | ORAL | 0 refills | Status: DC

## 2018-05-01 MED ORDER — MUPIROCIN 2 % EX OINT: 1.0000 "application " | TOPICAL_OINTMENT | Freq: Two times a day (BID) | CUTANEOUS | 0 refills | Status: DC

## 2018-05-01 NOTE — Progress Notes (Signed)
   HPI  Patient presents today with a lesion on the finger.  Patient explains that his left middle finger he developed a small blister 5 days ago.  He states the pressure was a lot so he spoke to a small hole and then the skin rubbed off a couple of days later. He states that the area is been draining and tender and not seem to be healing.  He denies fever, chills, sweats. He states that Band-Aids seem to make it feel sweaty and wet.   PMH: Smoking status noted ROS: Per HPI  Objective: BP 129/64   Pulse 79   Temp 97.9 F (36.6 C) (Oral)   Ht 6\' 2"  (1.88 m)   Wt 247 lb 6.4 oz (112.2 kg)   BMI 31.76 kg/m  Gen: NAD, alert, cooperative with exam HEENT: NCAT CV: RRR, good S1/S2, no murmur Resp: CTABL, no wheezes, non-labored Ext: No edema, warm Neuro: Alert and oriented, No gross deficits Skin:  L Medial distal 3rd digit with shallow ulceration measuring 1.2 cm X 1.0 cm with clear drainage  Assessment and plan:  #Skin lesion Shallow ulceration, I believe he is at some risk of developing infection and has some tenderness that could be sign of early infection. Keflex plus mupirocin Unlikely to be dangerous bullous condition, low threshold for follow-up if worsening or not improving    Meds ordered this encounter  Medications  . cephALEXin (KEFLEX) 500 MG capsule    Sig: Take 1 capsule (500 mg total) by mouth 3 (three) times daily.    Dispense:  21 capsule    Refill:  0  . mupirocin ointment (BACTROBAN) 2 %    Sig: Apply 1 application topically 2 (two) times daily.    Dispense:  22 g    Refill:  0    Murtis SinkSam Bradshaw, MD [facility] 05/01/2018, 3:42 PM

## 2018-05-01 NOTE — Patient Instructions (Signed)
Great to see you!  Use mupirocin ointment twice daily, cover the lesion any time that you may get your hands dirty.  Please come back if you develop blisters in the other areas. Is let us know if he began having redness or swelling of the finger.  Please come back with any concerns.

## 2018-05-13 ENCOUNTER — Other Ambulatory Visit: Payer: Self-pay | Admitting: Pediatrics

## 2018-05-13 DIAGNOSIS — M7989 Other specified soft tissue disorders: Secondary | ICD-10-CM

## 2018-05-25 NOTE — Progress Notes (Signed)
Subjective: CC: LE edema PCP: Timmothy Euler, MD Anthony Chavez is a 57 y.o. male presenting to clinic today for:  1. LE edema Patient reports a 4-1/2 to 69-monthhistory of bilateral lower extremity edema.  He notes that over the last 3 weeks it seems to be increasing in severity.  He has been using his Lasix intermittently but states that often makes him feel thirsty so he sometimes will skip days of use.  Denies any lower extremity pain or changes in sensation.  He denies NSAID use.  He reports fair amount of salt intake, citing that his wife cooks for him 50% of the time and 50% of the time he eats prepackaged or takeout foods.  He reports several times weekly intake of sodas.  Denies any compression hose use.  He works standing up quite a bit and states that the swelling seems to be worse in the evening.  Denies any blood loss or known anemia.  He states that he does drink alcohol about a half a solo cup per day during the week and a bit more on the weekends.  He feels like his food intake is adequate.  No known thyroid disease, renal dysfunction.  Denies chest pain, shortness of breath orthopnea.  ROS: Per HPI  No Known Allergies Past Medical History:  Diagnosis Date  . Gout   . Hypertension     Current Outpatient Medications:  .  allopurinol (ZYLOPRIM) 100 MG tablet, TAKE 2 TABLETS BY MOUTH EVERY DAY, Disp: 180 tablet, Rfl: 1 .  furosemide (LASIX) 20 MG tablet, TAKE 1 TABLET BY MOUTH EVERY DAY (Patient not taking: Reported on 05/26/2018), Disp: 90 tablet, Rfl: 1 .  KLOR-CON M20 20 MEQ tablet, TAKE 1 TABLET BY MOUTH EVERY DAY (Patient not taking: Reported on 05/26/2018), Disp: 90 tablet, Rfl: 1 .  mupirocin ointment (BACTROBAN) 2 %, Apply 1 application topically 2 (two) times daily. (Patient not taking: Reported on 05/26/2018), Disp: 22 g, Rfl: 0   Social History   Socioeconomic History  . Marital status: Single    Spouse name: Not on file  . Number of children: Not on  file  . Years of education: Not on file  . Highest education level: Not on file  Occupational History  . Occupation: aEngineer, petroleum Social Needs  . Financial resource strain: Not on file  . Food insecurity:    Worry: Not on file    Inability: Not on file  . Transportation needs:    Medical: Not on file    Non-medical: Not on file  Tobacco Use  . Smoking status: Never Smoker  . Smokeless tobacco: Never Used  Substance and Sexual Activity  . Alcohol use: Yes    Comment: 1 pint hard liquor per day  . Drug use: No  . Sexual activity: Not on file  Lifestyle  . Physical activity:    Days per week: Not on file    Minutes per session: Not on file  . Stress: Not on file  Relationships  . Social connections:    Talks on phone: Not on file    Gets together: Not on file    Attends religious service: Not on file    Active member of club or organization: Not on file    Attends meetings of clubs or organizations: Not on file    Relationship status: Not on file  . Intimate partner violence:    Fear of current or ex partner: Not on file  Emotionally abused: Not on file    Physically abused: Not on file    Forced sexual activity: Not on file  Other Topics Concern  . Not on file  Social History Narrative  . Not on file   No family history on file.  Objective: Office vital signs reviewed. BP 132/79   Pulse 80   Temp 98 F (36.7 C) (Oral)   Ht 6' 2"  (1.88 m)   Wt 249 lb (112.9 kg)   BMI 31.97 kg/m   Physical Examination:  General: Awake, alert, nontoxic, No acute distress HEENT: Normal    Neck: No masses palpated. No lymphadenopathy; no JVD or carotid bruits    Eyes: sclera white    Throat: moist mucus membranes Cardio: regular rate and rhythm, S1S2 heard, no murmurs appreciated Pulm: clear to auscultation bilaterally, no wheezes, rhonchi or rales; normal work of breathing on room air GI: protuberant. soft, non-tender, bowel sounds present x4, no appreciable  hepatomegaly, no splenomegaly, no masses Extremities: warm, well perfused, +2 pitting edema to knees. No cyanosis or clubbing; +1 pulses bilaterally Skin: dry; intact; no rashes or lesions Neuro: LE light touch sensation grossly intact  Assessment/ Plan: 57 y.o. male   1. Bilateral lower extremity edema Patient with normal vital signs.  No evidence of fluid overload except for lower extremity edema.  Cardiopulmonary exam was unremarkable.  Lower extremities with 2+ pitting edema to bilateral knees.  I reviewed his previous labs which did demonstrate elevation in his AST.  He consumes more than average amounts of alcohol daily.  Albumin level was within normal limits in March.  Unlikely to be a nutritional deficiency etiology of lower extremity edema.  He did have mild anemia on January 2019 labs.  It is possible that he is having peripheral edema secondary to decreased oncotic pressures.  Will obtain CMP, TSH, CBC and BNP.  We discussed salt reduction, use of compression hose, elevation of lower extremities and reduction in alcohol consumption.  I would like him to follow-up with PCP in 4 weeks for recheck.  Since Lasix is not helpful, I did ask him to hold off on use of this given hypokalemia in March.  Given use of alcohol, would likely benefit from MVI, thiamine and daily folic acid. - CMP14+EGFR - TSH - CBC with Differential - Brain natriuretic peptide  2. Excessive drinking of alcohol - CMP14+EGFR   Orders Placed This Encounter  Procedures  . CMP14+EGFR  . TSH  . CBC with Differential  . Brain natriuretic peptide    Janora Norlander, DO [place] [phone]

## 2018-05-26 ENCOUNTER — Encounter: Payer: Self-pay | Admitting: Pediatrics

## 2018-05-26 ENCOUNTER — Ambulatory Visit: Payer: BLUE CROSS/BLUE SHIELD | Admitting: Family Medicine

## 2018-05-26 VITALS — BP 132/79 | HR 80 | Temp 98.0°F | Ht 74.0 in | Wt 249.0 lb

## 2018-05-26 DIAGNOSIS — R600 Localized edema: Secondary | ICD-10-CM | POA: Diagnosis not present

## 2018-05-26 DIAGNOSIS — F1010 Alcohol abuse, uncomplicated: Secondary | ICD-10-CM | POA: Diagnosis not present

## 2018-05-26 NOTE — Patient Instructions (Signed)
You had labs performed today.  You will be contacted with the results of the labs once they are available, usually in the next 3 business days for routine lab work.   I have prescribed compression hose for you.  I want you to avoid salt in your diet.  You should reduce alcohol consumption.  Alcohol can cause swelling in legs.  Follow up with Dr Oswaldo Done in 4 weeks or sooner if needed.   DASH Eating Plan DASH stands for "Dietary Approaches to Stop Hypertension." The DASH eating plan is a healthy eating plan that has been shown to reduce high blood pressure (hypertension). It may also reduce your risk for type 2 diabetes, heart disease, and stroke. The DASH eating plan may also help with weight loss. What are tips for following this plan? General guidelines  Avoid eating more than 2,300 mg (milligrams) of salt (sodium) a day. If you have hypertension, you may need to reduce your sodium intake to 1,500 mg a day.  Limit alcohol intake to no more than 1 drink a day for nonpregnant women and 2 drinks a day for men. One drink equals 12 oz of beer, 5 oz of wine, or 1 oz of hard liquor.  Work with your health care provider to maintain a healthy body weight or to lose weight. Ask what an ideal weight is for you.  Get at least 30 minutes of exercise that causes your heart to beat faster (aerobic exercise) most days of the week. Activities may include walking, swimming, or biking.  Work with your health care provider or diet and nutrition specialist (dietitian) to adjust your eating plan to your individual calorie needs. Reading food labels  Check food labels for the amount of sodium per serving. Choose foods with less than 5 percent of the Daily Value of sodium. Generally, foods with less than 300 mg of sodium per serving fit into this eating plan.  To find whole grains, look for the word "whole" as the first word in the ingredient list. Shopping  Buy products labeled as "low-sodium" or "no salt  added."  Buy fresh foods. Avoid canned foods and premade or frozen meals. Cooking  Avoid adding salt when cooking. Use salt-free seasonings or herbs instead of table salt or sea salt. Check with your health care provider or pharmacist before using salt substitutes.  Do not fry foods. Cook foods using healthy methods such as baking, boiling, grilling, and broiling instead.  Cook with heart-healthy oils, such as olive, canola, soybean, or sunflower oil. Meal planning   Eat a balanced diet that includes: ? 5 or more servings of fruits and vegetables each day. At each meal, try to fill half of your plate with fruits and vegetables. ? Up to 6-8 servings of whole grains each day. ? Less than 6 oz of lean meat, poultry, or fish each day. A 3-oz serving of meat is about the same size as a deck of cards. One egg equals 1 oz. ? 2 servings of low-fat dairy each day. ? A serving of nuts, seeds, or beans 5 times each week. ? Heart-healthy fats. Healthy fats called Omega-3 fatty acids are found in foods such as flaxseeds and coldwater fish, like sardines, salmon, and mackerel.  Limit how much you eat of the following: ? Canned or prepackaged foods. ? Food that is high in trans fat, such as fried foods. ? Food that is high in saturated fat, such as fatty meat. ? Sweets, desserts, sugary drinks, and  other foods with added sugar. ? Full-fat dairy products.  Do not salt foods before eating.  Try to eat at least 2 vegetarian meals each week.  Eat more home-cooked food and less restaurant, buffet, and fast food.  When eating at a restaurant, ask that your food be prepared with less salt or no salt, if possible. What foods are recommended? The items listed may not be a complete list. Talk with your dietitian about what dietary choices are best for you. Grains Whole-grain or whole-wheat bread. Whole-grain or whole-wheat pasta. Brown rice. [place]. Bulgur. Whole-grain and low-sodium cereals.  Pita bread. Low-fat, low-sodium crackers. Whole-wheat flour tortillas. Vegetables Fresh or frozen vegetables (raw, steamed, roasted, or grilled). Low-sodium or reduced-sodium tomato and vegetable juice. Low-sodium or reduced-sodium tomato sauce and tomato paste. Low-sodium or reduced-sodium canned vegetables. Fruits All fresh, dried, or frozen fruit. Canned fruit in natural juice (without added sugar). Meat and other protein foods Skinless chicken or [place]. Ground chicken or [place]. Pork with fat trimmed off. Fish and seafood. Egg whites. Dried beans, peas, or lentils. Unsalted nuts, nut butters, and seeds. Unsalted canned beans. Lean cuts of beef with fat trimmed off. Low-sodium, lean deli meat. Dairy Low-fat (1%) or fat-free (skim) milk. Fat-free, low-fat, or reduced-fat cheeses. Nonfat, low-sodium ricotta or cottage cheese. Low-fat or nonfat yogurt. Low-fat, low-sodium cheese. Fats and oils Soft margarine without trans fats. Vegetable oil. Low-fat, reduced-fat, or light mayonnaise and salad dressings (reduced-sodium). Canola, safflower, olive, soybean, and sunflower oils. Avocado. Seasoning and other foods Herbs. Spices. Seasoning mixes without salt. Unsalted popcorn and pretzels. Fat-free sweets. What foods are not recommended? The items listed may not be a complete list. Talk with your dietitian about what dietary choices are best for you. Grains Baked goods made with fat, such as croissants, muffins, or some breads. Dry pasta or rice meal packs. Vegetables Creamed or fried vegetables. Vegetables in a cheese sauce. Regular canned vegetables (not low-sodium or reduced-sodium). Regular canned tomato sauce and paste (not low-sodium or reduced-sodium). Regular tomato and vegetable juice (not low-sodium or reduced-sodium). Angie Fava. Olives. Fruits Canned fruit in a light or heavy syrup. Fried fruit. Fruit in cream or butter sauce. Meat and other protein foods Fatty cuts of meat. Ribs. Fried  meat. Berniece Salines. Sausage. Bologna and other processed lunch meats. Salami. Fatback. Hotdogs. Bratwurst. Salted nuts and seeds. Canned beans with added salt. Canned or smoked fish. Whole eggs or egg yolks. Chicken or [place] with skin. Dairy Whole or 2% milk, cream, and half-and-half. Whole or full-fat cream cheese. Whole-fat or sweetened yogurt. Full-fat cheese. Nondairy creamers. Whipped toppings. Processed cheese and cheese spreads. Fats and oils Butter. Stick margarine. Lard. Shortening. Ghee. Bacon fat. Tropical oils, such as coconut, palm kernel, or palm oil. Seasoning and other foods Salted popcorn and pretzels. Onion salt, garlic salt, seasoned salt, table salt, and sea salt. Worcestershire sauce. Tartar sauce. Barbecue sauce. Teriyaki sauce. Soy sauce, including reduced-sodium. Steak sauce. Canned and packaged gravies. Fish sauce. Oyster sauce. Cocktail sauce. Horseradish that you find on the shelf. Ketchup. Mustard. Meat flavorings and tenderizers. Bouillon cubes. Hot sauce and Tabasco sauce. Premade or packaged marinades. Premade or packaged taco seasonings. Relishes. Regular salad dressings. Where to find more information:  National Heart, Lung, and [place]: https://wilson-eaton.com/  American Heart Association: www.heart.org Summary  The DASH eating plan is a healthy eating plan that has been shown to reduce high blood pressure (hypertension). It may also reduce your risk for type 2 diabetes, heart disease, and stroke.  With the DASH eating plan, you should limit salt (sodium) intake to 2,300 mg a day. If you have hypertension, you may need to reduce your sodium intake to 1,500 mg a day.  When on the DASH eating plan, aim to eat more fresh fruits and vegetables, whole grains, lean proteins, low-fat dairy, and heart-healthy fats.  Work with your health care provider or diet and nutrition specialist (dietitian) to adjust your eating plan to your individual calorie needs. This information is  not intended to replace advice given to you by your health care provider. Make sure you discuss any questions you have with your health care provider. Document Released: 10/07/2011 Document Revised: 10/11/2016 Document Reviewed: 10/11/2016 Elsevier Interactive Patient Education  2018 [employer]. Edema Edema is an abnormal buildup of fluids in your bodytissues. Edema is somewhatdependent on gravity to pull the fluid to the lowest place in your body. That makes the condition more common in the legs and thighs (lower extremities). Painless swelling of the feet and ankles is common and becomes more likely as you get older. It is also common in looser tissues, like around your eyes. When the affected area is squeezed, the fluid may move out of that spot and leave a dent for a few moments. This dent is called pitting. What are the causes? There are many possible causes of edema. Eating too much salt and being on your feet or sitting for a long time can cause edema in your legs and ankles. Hot weather may make edema worse. Common medical causes of edema include:  Heart failure.  Liver disease.  Kidney disease.  Weak blood vessels in your legs.  Cancer.  An injury.  Pregnancy.  Some medications.  Obesity.  What are the signs or symptoms? Edema is usually painless.Your skin may look swollen or shiny. How is this diagnosed? Your health care provider may be able to diagnose edema by asking about your medical history and doing a physical exam. You may need to have tests such as X-rays, an electrocardiogram, or blood tests to check for medical conditions that may cause edema. How is this treated? Edema treatment depends on the cause. If you have heart, liver, or kidney disease, you need the treatment appropriate for these conditions. General treatment may include:  Elevation of the affected body part above the level of your heart.  Compression of the affected body part. Pressure from  elastic bandages or support stockings squeezes the tissues and forces fluid back into the blood vessels. This keeps fluid from entering the tissues.  Restriction of fluid and salt intake.  Use of a water pill (diuretic). These medications are appropriate only for some types of edema. They pull fluid out of your body and make you urinate more often. This gets rid of fluid and reduces swelling, but diuretics can have side effects. Only use diuretics as directed by your health care provider.  Follow these instructions at home:  Keep the affected body part above the level of your heart when you are lying down.  Do not sit still or stand for prolonged periods.  Do not put anything directly under your knees when lying down.  Do not wear constricting clothing or garters on your upper legs.  Exercise your legs to work the fluid back into your blood vessels. This may help the swelling go down.  Wear elastic bandages or support stockings to reduce ankle swelling as directed by your health care provider.  Eat a low-salt diet to reduce  fluid if your health care provider recommends it.  Only take medicines as directed by your health care provider. Contact a health care provider if:  Your edema is not responding to treatment.  You have heart, liver, or kidney disease and notice symptoms of edema.  You have edema in your legs that does not improve after elevating them.  You have sudden and unexplained weight gain. Get help right away if:  You develop shortness of breath or chest pain.  You cannot breathe when you lie down.  You develop pain, redness, or warmth in the swollen areas.  You have heart, liver, or kidney disease and suddenly get edema.  You have a fever and your symptoms suddenly get worse. This information is not intended to replace advice given to you by your health care provider. Make sure you discuss any questions you have with your health care provider. Document Released:  10/18/2005 Document Revised: 03/25/2016 Document Reviewed: 08/10/2013 Elsevier Interactive Patient Education  2017 [employer]

## 2018-05-27 LAB — CMP14+EGFR
A/G RATIO: 1.6 (ref 1.2–2.2)
ALBUMIN: 4.5 g/dL (ref 3.5–5.5)
ALT: 22 IU/L (ref 0–44)
AST: 56 IU/L — ABNORMAL HIGH (ref 0–40)
Alkaline Phosphatase: 66 IU/L (ref 39–117)
BILIRUBIN TOTAL: 0.7 mg/dL (ref 0.0–1.2)
BUN / CREAT RATIO: 10 (ref 9–20)
BUN: 10 mg/dL (ref 6–24)
CALCIUM: 9.1 mg/dL (ref 8.7–10.2)
CHLORIDE: 98 mmol/L (ref 96–106)
CO2: 25 mmol/L (ref 20–29)
Creatinine, Ser: 0.96 mg/dL (ref 0.76–1.27)
GFR, EST AFRICAN AMERICAN: 102 mL/min/{1.73_m2} (ref >59)
GFR, EST NON AFRICAN AMERICAN: 88 mL/min/{1.73_m2} (ref >59)
Globulin, Total: 2.8 g/dL (ref 1.5–4.5)
Glucose: 86 mg/dL (ref 65–99)
POTASSIUM: 3.5 mmol/L (ref 3.5–5.2)
Sodium: 143 mmol/L (ref 134–144)
TOTAL PROTEIN: 7.3 g/dL (ref 6.0–8.5)

## 2018-05-27 LAB — CBC WITH DIFFERENTIAL/PLATELET
BASOS ABS: 0.1 10*3/uL (ref 0.0–0.2)
Basos: 1 %
EOS (ABSOLUTE): 0.1 10*3/uL (ref 0.0–0.4)
Eos: 3 %
Hematocrit: 46 % (ref 37.5–51.0)
Hemoglobin: 15.7 g/dL (ref 13.0–17.7)
Immature Grans (Abs): 0 10*3/uL (ref 0.0–0.1)
Immature Granulocytes: 0 %
LYMPHS ABS: 1.8 10*3/uL (ref 0.7–3.1)
Lymphs: 40 %
MCH: 34 pg — AB (ref 26.6–33.0)
MCHC: 34.1 g/dL (ref 31.5–35.7)
MCV: 100 fL — ABNORMAL HIGH (ref 79–97)
Monocytes Absolute: 0.6 10*3/uL (ref 0.1–0.9)
Monocytes: 13 %
Neutrophils Absolute: 1.9 10*3/uL (ref 1.4–7.0)
Neutrophils: 43 %
Platelets: 254 10*3/uL (ref 150–450)
RBC: 4.62 x10E6/uL (ref 4.14–5.80)
RDW: 12.8 % (ref 12.3–15.4)
WBC: 4.4 10*3/uL (ref 3.4–10.8)

## 2018-05-27 LAB — TSH: TSH: 3.55 u[IU]/mL (ref 0.450–4.500)

## 2018-05-27 LAB — BRAIN NATRIURETIC PEPTIDE: BNP: 15.8 pg/mL (ref 0.0–100.0)

## 2018-06-06 ENCOUNTER — Other Ambulatory Visit: Payer: Self-pay | Admitting: Family Medicine

## 2018-06-06 DIAGNOSIS — I10 Essential (primary) hypertension: Secondary | ICD-10-CM

## 2018-06-19 ENCOUNTER — Ambulatory Visit: Payer: BLUE CROSS/BLUE SHIELD | Admitting: Pediatrics

## 2018-06-19 ENCOUNTER — Encounter: Payer: Self-pay | Admitting: Pediatrics

## 2018-06-19 VITALS — BP 129/77 | HR 82 | Temp 97.3°F | Ht 74.0 in | Wt 241.0 lb

## 2018-06-19 DIAGNOSIS — I10 Essential (primary) hypertension: Secondary | ICD-10-CM | POA: Diagnosis not present

## 2018-06-19 DIAGNOSIS — M7989 Other specified soft tissue disorders: Secondary | ICD-10-CM

## 2018-06-19 MED ORDER — FUROSEMIDE 20 MG PO TABS: 20.0000 mg | ORAL_TABLET | Freq: Every day | ORAL | 1 refills | Status: DC | PRN

## 2018-06-19 MED ORDER — POTASSIUM CHLORIDE CRYS ER 20 MEQ PO TBCR
20.0000 meq | EXTENDED_RELEASE_TABLET | Freq: Every day | ORAL | 1 refills | Status: DC
Start: 2018-06-19 — End: 2019-05-03

## 2018-06-19 MED ORDER — OLMESARTAN MEDOXOMIL 40 MG PO TABS: 40.0000 mg | ORAL_TABLET | Freq: Every day | ORAL | 5 refills | Status: DC

## 2018-06-19 NOTE — Progress Notes (Signed)
  Subjective:   Patient ID: Anthony Chavez, male    DOB: [birth year], 57 y.o.   MRN: [id number] CC: Edema (4 week recheck)  HPI: Anthony Chavez is a 57 y.o. male   Past 8 weeks has regularly had swelling in his ankles and lower legs.  Gets worse throughout the day, the longer he is on his feet.  He is on his feet walking constantly at work. Is gone by the time he wakes up. Lasix doesn't help, just makes him very thirsty. Compression hose help some, still with significant swelling at end of day. On days he is not on his feet as much swelling is less.  Appetite has been fine, eating regularly throughout day. A few drinks at night before bed nightly.   No SOB or chest pain.   Relevant past medical, surgical, family and social history reviewed. Allergies and medications reviewed and updated. Social History   Tobacco Use  Smoking Status Never Smoker  Smokeless Tobacco Never Used   ROS: Per HPI   Objective:    BP 129/77   Pulse 82   Temp (!) 97.3 F (36.3 C) (Oral)   Ht 6' 2" (1.88 m)   Wt 241 lb (109.3 kg)   BMI 30.94 kg/m   Wt Readings from Last 3 Encounters:  06/19/18 241 lb (109.3 kg)  05/26/18 249 lb (112.9 kg)  05/01/18 247 lb 6.4 oz (112.2 kg)    Gen: NAD, alert, cooperative with exam, NCAT EYES: EOMI, no conjunctival injection, or no icterus CV: NRRR, normal S1/S2, no murmur, distal pulses 2+ b/l Resp: CTABL, no wheezes, normal WOB Abd: +BS, soft, NTND. no guarding or organomegaly Ext: Non-pitting edema present b/l ankles, warm Neuro: Alert and oriented, strength equal b/l UE and LE, coordination grossly normal MSK: normal muscle bulk  Assessment & Plan:  Anthony Chavez was seen today for edema.  Diagnoses and all orders for this visit:  Essential hypertension Stop amlodipine, start olmesartan back for blood pressure control.  Possible that the calcium channel blocker contributing to his edema.  Wear compression hose every day to work.  Follow-up for blood work in 1 week  after start of olmesartan. -     olmesartan (BENICAR) 40 MG tablet; Take 1 tablet (40 mg total) by mouth daily. -     CMP14+EGFR; Future  Leg swelling Take Lasix with potassium only as needed for swelling present in the morning. -     furosemide (LASIX) 20 MG tablet; Take 1 tablet (20 mg total) by mouth daily as needed. -     potassium chloride SA (KLOR-CON M20) 20 MEQ tablet; Take 1 tablet (20 mEq total) by mouth when you take the lasix.   Follow up plan: Return in about 7 weeks (around 08/07/2018). Assunta Found, MD [place]

## 2018-06-19 NOTE — Patient Instructions (Signed)
Compression hose every day to work even if swelling is not present in the morning.  Stop amlodipine.  Lasix as needed, not daily, take with potassium only if swelling present in the morning.  Start olmesartan 40mg  for blood pressure control.

## 2018-08-09 ENCOUNTER — Ambulatory Visit: Payer: BLUE CROSS/BLUE SHIELD | Admitting: Pediatrics

## 2018-09-22 ENCOUNTER — Telehealth: Payer: Self-pay | Admitting: Pediatrics

## 2018-10-10 IMAGING — US US ABDOMEN COMPLETE
1 series · 14 of 25 positions shown · non-contrast
Comparison: No recent prior.

CLINICAL DATA: Transaminitis.

EXAM:
ABDOMEN ULTRASOUND COMPLETE

[Series 1: us abdomen complete · 0.23mm/px · 14 of 68 slices shown]
[im 1/68]
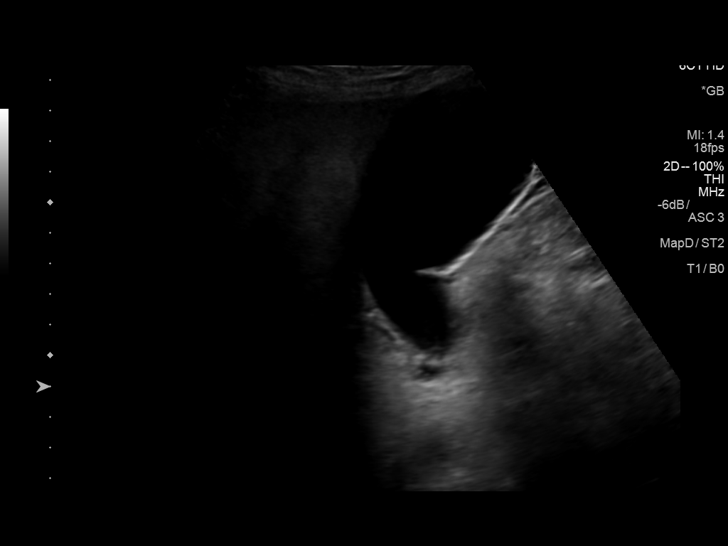
[im 6/68]
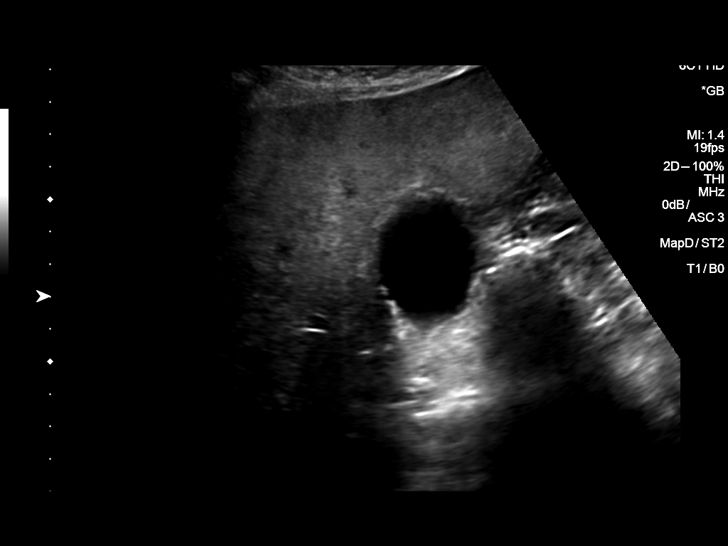
[im 12/68]
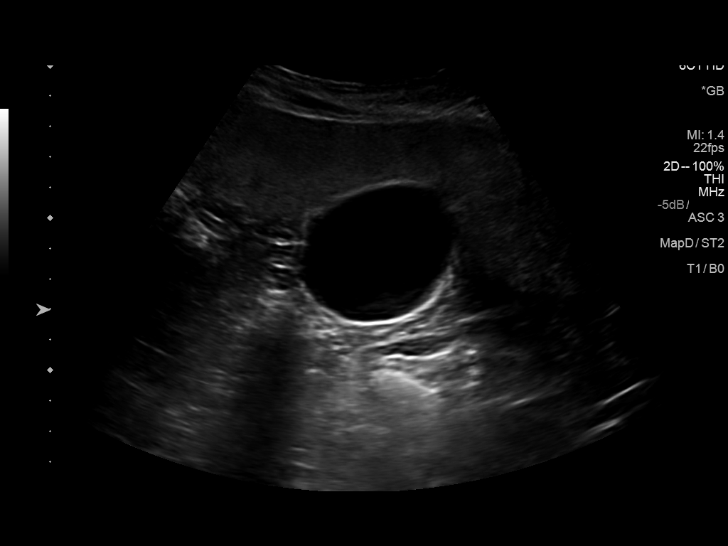
[im 17/68]
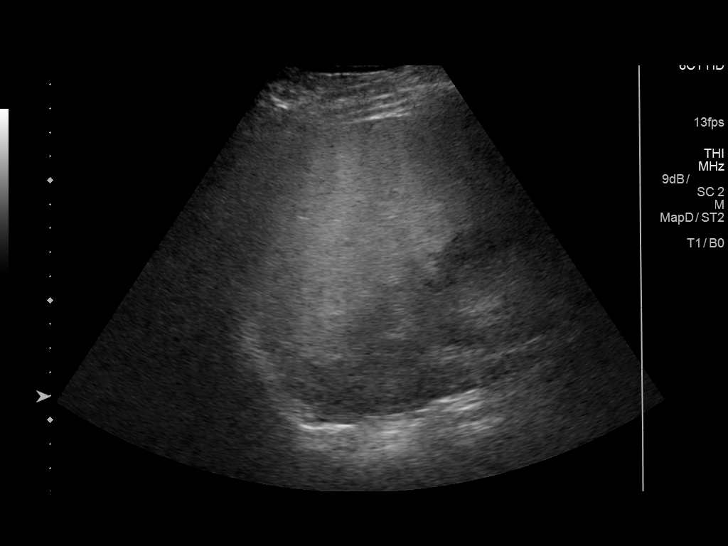
[im 23/68]
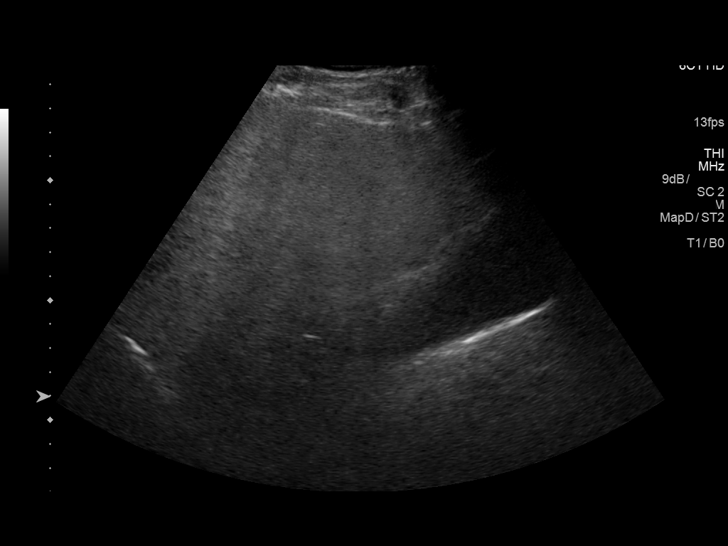
[im 26/68]
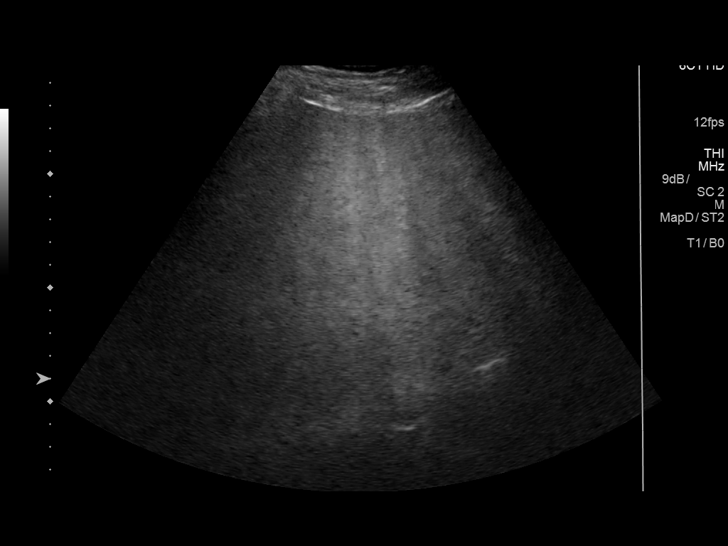
[im 31/68]
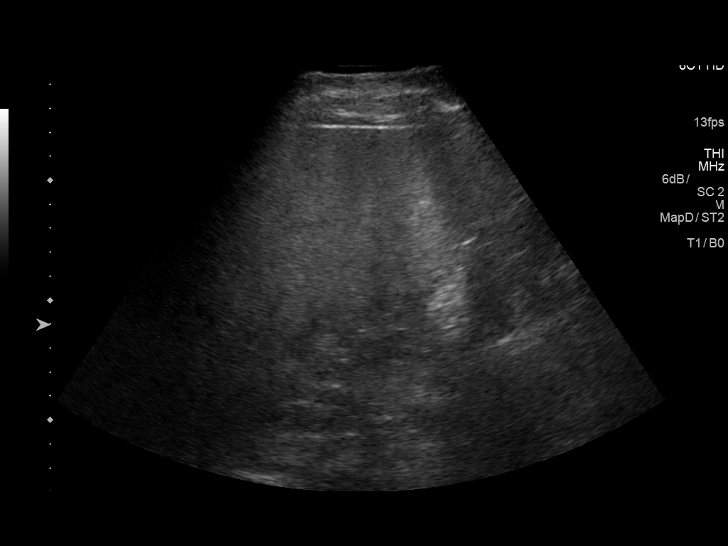
[im 37/68]
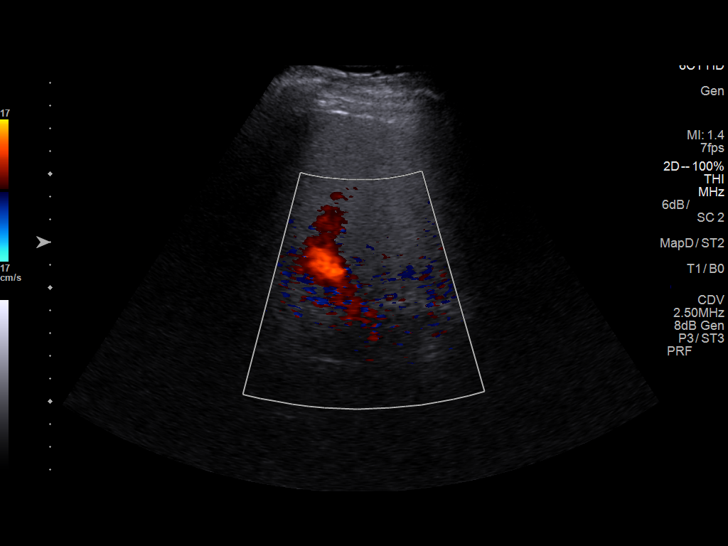
[im 42/68]
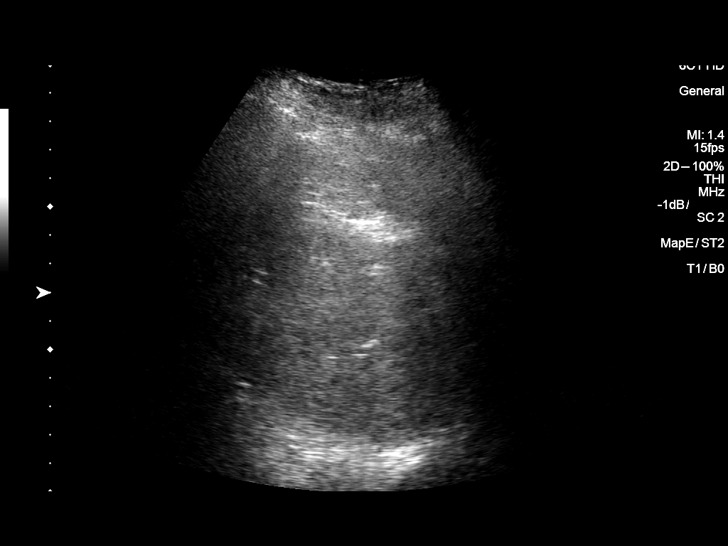
[im 45/68]
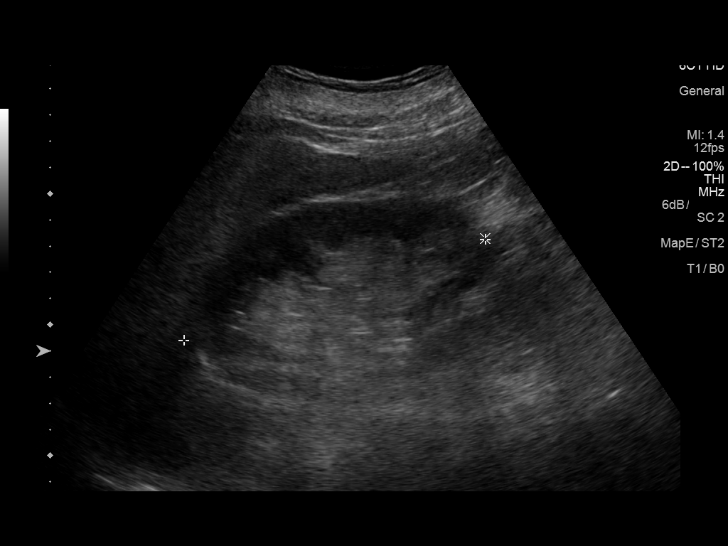
[im 51/68]
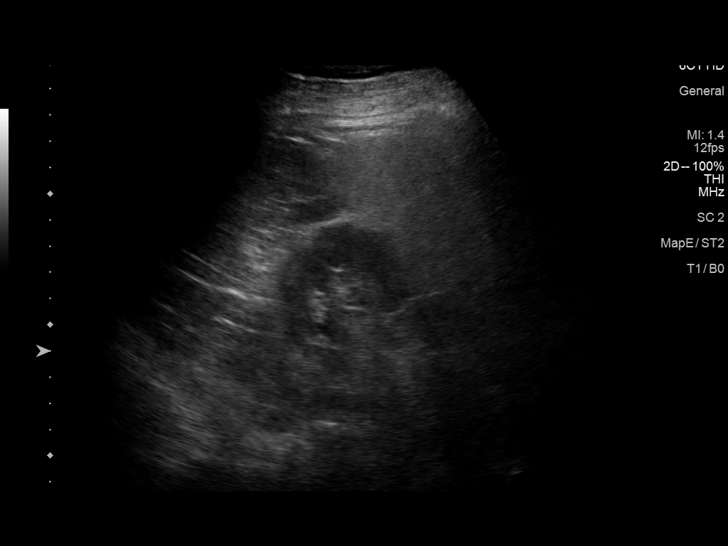
[im 56/68]
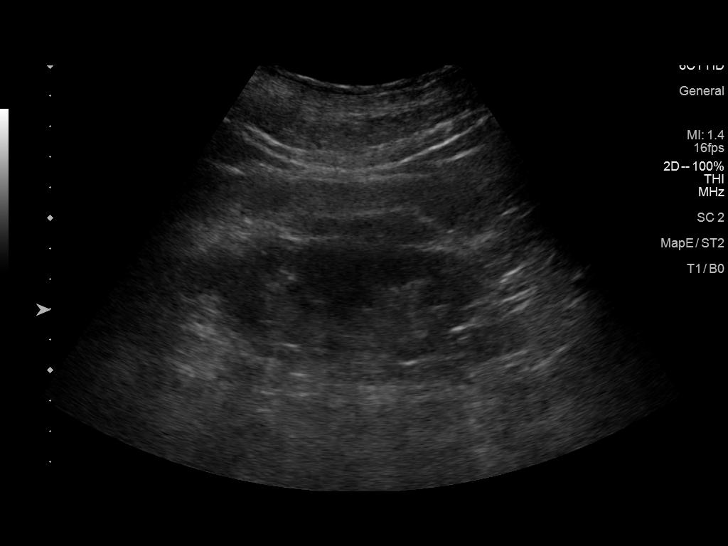
[im 62/68]
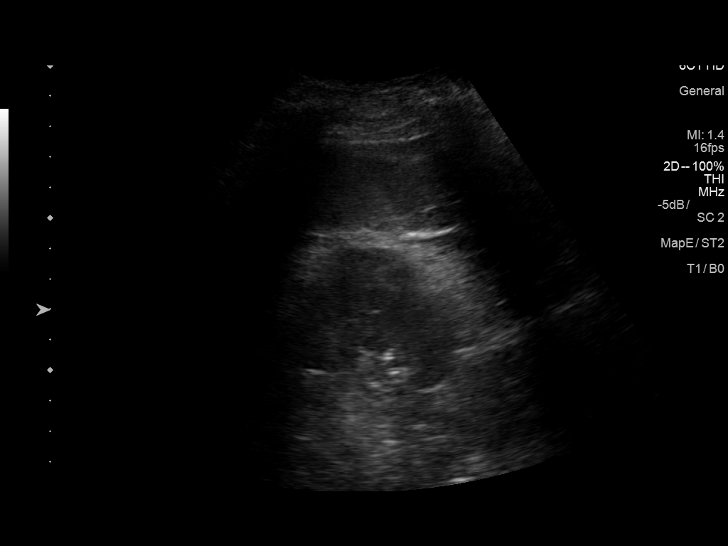
[im 68/68]
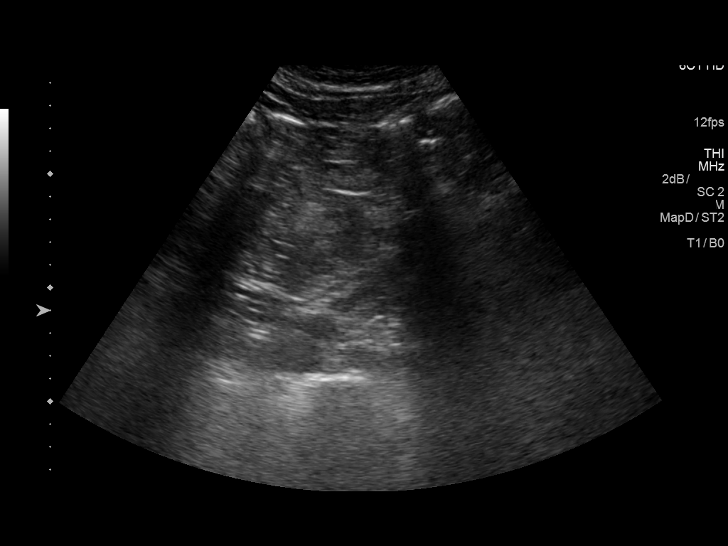

[14 of 25 positions shown; findings below may reference images not displayed]

FINDINGS: Gallbladder: No gallstones or wall thickening visualized. No
sonographic Murphy sign noted by sonographer.

Common bile duct: Diameter: 2.7 mm

Liver: Increased echogenicity liver consistent fatty infiltration
and/or hepatocellular disease. Portal vein is patent on color
Doppler imaging with normal direction of blood flow towards the
liver.

IVC: Poorly visualized due to bowel gas.

Pancreas: Poorly visualized due to bowel gas.

Spleen: Size and appearance within normal limits.

Right Kidney: Length: 12.1 cm. Echogenicity within normal limits. No
mass or hydronephrosis visualized.

Left Kidney: Length: 0.5 cm. Echogenicity within normal limits. No
mass or hydronephrosis visualized.

Abdominal aorta: No aneurysm visualized.

Other findings: None.
IMPRESSION: 1. Increased echogenicity of the liver consistent fatty infiltration
and/or hepatocellular disease.

2. Exam otherwise unremarkable. No gallstones or biliary distention.

## 2018-11-07 ENCOUNTER — Other Ambulatory Visit: Payer: Self-pay | Admitting: *Deleted

## 2018-11-07 MED ORDER — ALLOPURINOL 100 MG PO TABS: 200.0000 mg | ORAL_TABLET | Freq: Every day | ORAL | 0 refills | Status: DC

## 2018-12-13 ENCOUNTER — Other Ambulatory Visit: Payer: Self-pay | Admitting: Pediatrics

## 2018-12-13 DIAGNOSIS — I10 Essential (primary) hypertension: Secondary | ICD-10-CM

## 2019-01-08 ENCOUNTER — Other Ambulatory Visit: Payer: Self-pay | Admitting: Pediatrics

## 2019-01-08 DIAGNOSIS — I10 Essential (primary) hypertension: Secondary | ICD-10-CM

## 2019-01-08 NOTE — Telephone Encounter (Signed)
Former Surveyor, quantity. NTBS 30 days given 12/13/18

## 2019-01-08 NOTE — Telephone Encounter (Signed)
Last lab work done July 2019.  Left message, need to schedule an appointment with a new provider to receive refills.

## 2019-01-22 ENCOUNTER — Other Ambulatory Visit: Payer: Self-pay | Admitting: Pediatrics

## 2019-01-22 DIAGNOSIS — I10 Essential (primary) hypertension: Secondary | ICD-10-CM

## 2019-03-22 ENCOUNTER — Telehealth: Payer: Self-pay | Admitting: Pediatrics

## 2019-03-22 DIAGNOSIS — F1010 Alcohol abuse, uncomplicated: Secondary | ICD-10-CM | POA: Diagnosis not present

## 2019-03-22 DIAGNOSIS — R0602 Shortness of breath: Secondary | ICD-10-CM | POA: Diagnosis not present

## 2019-03-22 DIAGNOSIS — D61818 Other pancytopenia: Secondary | ICD-10-CM | POA: Diagnosis not present

## 2019-03-22 DIAGNOSIS — R4182 Altered mental status, unspecified: Secondary | ICD-10-CM | POA: Diagnosis not present

## 2019-03-22 DIAGNOSIS — R5383 Other fatigue: Secondary | ICD-10-CM | POA: Diagnosis not present

## 2019-03-22 DIAGNOSIS — R531 Weakness: Secondary | ICD-10-CM | POA: Diagnosis not present

## 2019-03-22 DIAGNOSIS — R945 Abnormal results of liver function studies: Secondary | ICD-10-CM | POA: Diagnosis not present

## 2019-03-22 NOTE — Telephone Encounter (Signed)
LM [id number]/21-jhb

## 2019-03-22 NOTE — Telephone Encounter (Signed)
Lethargic and tired - worsening - to point of almost falling - wife just had surgery and she can not take him anywhere - she is aware to call 911 and that he need emergent care.

## 2019-03-23 DIAGNOSIS — R9431 Abnormal electrocardiogram [ECG] [EKG]: Secondary | ICD-10-CM | POA: Diagnosis not present

## 2019-03-23 DIAGNOSIS — R160 Hepatomegaly, not elsewhere classified: Secondary | ICD-10-CM | POA: Diagnosis not present

## 2019-03-23 DIAGNOSIS — F1010 Alcohol abuse, uncomplicated: Secondary | ICD-10-CM | POA: Diagnosis not present

## 2019-04-02 ENCOUNTER — Ambulatory Visit: Payer: BLUE CROSS/BLUE SHIELD | Admitting: Family Medicine

## 2019-04-03 ENCOUNTER — Other Ambulatory Visit: Payer: Self-pay

## 2019-04-03 ENCOUNTER — Other Ambulatory Visit: Payer: Self-pay | Admitting: Family Medicine

## 2019-04-03 ENCOUNTER — Ambulatory Visit: Payer: BLUE CROSS/BLUE SHIELD | Admitting: Family Medicine

## 2019-04-03 ENCOUNTER — Encounter: Payer: Self-pay | Admitting: Family Medicine

## 2019-04-03 VITALS — BP 127/85 | HR 88 | Temp 96.9°F | Ht 74.0 in | Wt 231.0 lb

## 2019-04-03 DIAGNOSIS — M1A0790 Idiopathic chronic gout, unspecified ankle and foot, without tophus (tophi): Secondary | ICD-10-CM

## 2019-04-03 DIAGNOSIS — I10 Essential (primary) hypertension: Secondary | ICD-10-CM | POA: Diagnosis not present

## 2019-04-03 DIAGNOSIS — F10230 Alcohol dependence with withdrawal, uncomplicated: Secondary | ICD-10-CM

## 2019-04-03 DIAGNOSIS — E782 Mixed hyperlipidemia: Secondary | ICD-10-CM

## 2019-04-03 DIAGNOSIS — D61818 Other pancytopenia: Secondary | ICD-10-CM

## 2019-04-03 DIAGNOSIS — E539 Vitamin B deficiency, unspecified: Secondary | ICD-10-CM

## 2019-04-03 DIAGNOSIS — Z09 Encounter for follow-up examination after completed treatment for conditions other than malignant neoplasm: Secondary | ICD-10-CM | POA: Diagnosis not present

## 2019-04-03 DIAGNOSIS — F10930 Alcohol use, unspecified with withdrawal, uncomplicated: Secondary | ICD-10-CM

## 2019-04-03 DIAGNOSIS — F339 Major depressive disorder, recurrent, unspecified: Secondary | ICD-10-CM

## 2019-04-03 MED ORDER — OLMESARTAN MEDOXOMIL 40 MG PO TABS: 40.0000 mg | ORAL_TABLET | Freq: Every day | ORAL | 3 refills | Status: DC

## 2019-04-03 MED ORDER — ALLOPURINOL 100 MG PO TABS: 200.0000 mg | ORAL_TABLET | Freq: Every day | ORAL | 3 refills | Status: DC

## 2019-04-03 MED ORDER — SERTRALINE HCL 50 MG PO TABS: 50.0000 mg | ORAL_TABLET | Freq: Every day | ORAL | 3 refills | Status: DC

## 2019-04-03 NOTE — Telephone Encounter (Signed)
appt scheduled

## 2019-04-03 NOTE — Progress Notes (Signed)
Subjective:  Patient ID: Anthony Chavez, male    DOB: 1960/11/16, 58 y.o.   MRN: [id number]  Chief Complaint:  Hospitalization Follow-up   HPI: Anthony Chavez is a 58 y.o. male presenting on 04/03/2019 for Hospitalization Follow-up  Pt presents today for hospital discharge follow up. Pt was admitted to [facility] on 03/22/2019 for alcohol detox. Pt states he left the hospital on 03/25/2019 because they were not doing anything for him. States he was just sitting in the room and not getting any treatment. Pt states he was prescribed librium upon discharge. Pt states he is only taking this at night because he can not take it and work. States he is very depressed. He states this has been ongoing since discharge. States he usually drinks to control his emotions. States he has not been able to do this and his emotions are overwhelming. Pt states he has not been to a counselor or AA since discharge. States he feels this is not for him. States if he stops drinking he wants to do it on his own. He states he has been alcohol free for 10 days. He has been a longstanding alcohol, around 30 years per pt. States he would drink at least a pint of liquor per night. States he feels he will be able to remain sober because he wants to. States he has a good support system. States his wife is at home and is very supportive.   During admission pt was diagnosed with the following: Hepatic steatosis, likely cirrhosis Alcohol abuse Alcohol withdrawal Pancytopenia Hypomagnesemia Folate deficiency Vit B deficiency Hypertension Gout  Labs available in Care Everywhere   Pt denies other complaints or concerns. States he is compliant with his medications. He does not check his blood pressure at home. He does not exercise or watch his diet. No associated medication side effects. No SI or HI.   Depression screen [facility] 2/9 04/03/2019 06/19/2018 05/26/2018 05/01/2018 01/16/2018  Decreased Interest 3 0 0 0 0  Down, Depressed,  Hopeless 1 0 0 0 0  PHQ - 2 Score 4 0 0 0 0  Altered sleeping 3 - - - -  Tired, decreased energy 3 - - - -  Change in appetite 0 - - - -  Feeling bad or failure about yourself  1 - - - -  Trouble concentrating 1 - - - -  Moving slowly or fidgety/restless 3 - - - -  Suicidal thoughts 0 - - - -  PHQ-9 Score 15 - - - -  Difficult doing work/chores - - - - -     Relevant past medical, surgical, family, and social history reviewed and updated as indicated.  Allergies and medications reviewed and updated.   Past Medical History:  Diagnosis Date  . Gout   . Hypertension     Past Surgical History:  Procedure Laterality Date  . HERNIA REPAIR    . ring finger surgery Left   . TONSILLECTOMY      Social History   Socioeconomic History  . Marital status: Married    Spouse name: Not on file  . Number of children: Not on file  . Years of education: Not on file  . Highest education level: Not on file  Occupational History  . Occupation: Engineer, petroleum  Social Needs  . Financial resource strain: Not on file  . Food insecurity:    Worry: Not on file    Inability: Not on file  . Transportation  needs:    Medical: Not on file    Non-medical: Not on file  Tobacco Use  . Smoking status: Never Smoker  . Smokeless tobacco: Never Used  Substance and Sexual Activity  . Alcohol use: Yes    Comment: 1 pint hard liquor per day  . Drug use: No  . Sexual activity: Not on file  Lifestyle  . Physical activity:    Days per week: Not on file    Minutes per session: Not on file  . Stress: Not on file  Relationships  . Social connections:    Talks on phone: Not on file    Gets together: Not on file    Attends religious service: Not on file    Active member of club or organization: Not on file    Attends meetings of clubs or organizations: Not on file    Relationship status: Not on file  . Intimate partner violence:    Fear of current or ex partner: Not on file    Emotionally  abused: Not on file    Physically abused: Not on file    Forced sexual activity: Not on file  Other Topics Concern  . Not on file  Social History Narrative  . Not on file    Outpatient Encounter Medications as of 04/03/2019  Medication Sig  . allopurinol (ZYLOPRIM) 100 MG tablet Take 2 tablets (200 mg total) by mouth daily. (Needs to be seen before next refill)  . chlordiazePOXIDE (LIBRIUM) 25 MG capsule   . folic acid (FOLVITE) 1 MG tablet   . furosemide (LASIX) 20 MG tablet Take 1 tablet (20 mg total) by mouth daily as needed.  . magnesium oxide (MAG-OX) 400 (241.3 Mg) MG tablet   . Multiple Vitamin (THERA) TABS   . olmesartan (BENICAR) 40 MG tablet Take 1 tablet (40 mg total) by mouth daily. (Needs to be seen before next refill)  . potassium chloride SA (KLOR-CON M20) 20 MEQ tablet Take 1 tablet (20 mEq total) by mouth daily. When you take the lasix  . Thiamine HCl (VITAMIN B1) 100 MG TABS   . [DISCONTINUED] allopurinol (ZYLOPRIM) 100 MG tablet Take 2 tablets (200 mg total) by mouth daily. (Needs to be seen before next refill)  . [DISCONTINUED] olmesartan (BENICAR) 40 MG tablet Take 1 tablet (40 mg total) by mouth daily. (Needs to be seen before next refill)  . sertraline (ZOLOFT) 50 MG tablet Take 1 tablet (50 mg total) by mouth daily.   No facility-administered encounter medications on file as of 04/03/2019.     No Known Allergies  Review of Systems  Constitutional: Positive for fatigue. Negative for activity change, appetite change, chills, diaphoresis, fever and unexpected weight change.  Eyes: Negative for photophobia and visual disturbance.  Respiratory: Negative for cough, chest tightness, shortness of breath and wheezing.   Cardiovascular: Negative for chest pain, palpitations and leg swelling.  Gastrointestinal: Negative for abdominal pain, anal bleeding, blood in stool, constipation, diarrhea, nausea and vomiting.  Endocrine: Negative for cold intolerance, heat  intolerance, polydipsia, polyphagia and polyuria.  Genitourinary: Positive for scrotal swelling. Negative for decreased urine volume, difficulty urinating and hematuria.  Musculoskeletal: Positive for gait problem (feels off balance). Negative for arthralgias, joint swelling and myalgias.  Skin: Negative for rash.  Neurological: Positive for tremors. Negative for dizziness, seizures, syncope, facial asymmetry, speech difficulty, weakness, light-headedness, numbness and headaches.       Feeling off balance  Hematological: Bruises/bleeds easily.  Psychiatric/Behavioral: Positive for decreased concentration,  dysphoric mood and sleep disturbance. Negative for agitation, behavioral problems, confusion, hallucinations, self-injury and suicidal ideas. The patient is nervous/anxious. The patient is not hyperactive.   All other systems reviewed and are negative.       Objective:  BP 127/85   Pulse 88   Temp (!) 96.9 F (36.1 C) (Oral)   Ht 6' 2" (1.88 m)   Wt 231 lb (104.8 kg)   BMI 29.66 kg/m    Wt Readings from Last 3 Encounters:  04/03/19 231 lb (104.8 kg)  06/19/18 241 lb (109.3 kg)  05/26/18 249 lb (112.9 kg)    Physical Exam Vitals signs and nursing note reviewed.  Constitutional:      General: He is not in acute distress.    Appearance: He is well-developed and well-groomed. He is not ill-appearing or toxic-appearing.  HENT:     Head: Normocephalic and atraumatic.     Jaw: There is normal jaw occlusion.     Right Ear: Hearing normal.     Left Ear: Hearing normal.     Nose: Nose normal.     Mouth/Throat:     Lips: Pink.     Mouth: Mucous membranes are moist.     Pharynx: Oropharynx is clear.  Eyes:     General: Lids are normal.     Extraocular Movements: Extraocular movements intact.     Conjunctiva/sclera: Conjunctivae normal.     Pupils: Pupils are equal, round, and reactive to light.  Neck:     Musculoskeletal: Neck supple.     Thyroid: No thyroid mass, thyromegaly  or thyroid tenderness.     Trachea: Trachea and phonation normal.  Cardiovascular:     Rate and Rhythm: Normal rate and regular rhythm.     Pulses: Normal pulses.     Heart sounds: Normal heart sounds. No murmur. No friction rub. No gallop.   Pulmonary:     Effort: Pulmonary effort is normal. No respiratory distress.     Breath sounds: Normal breath sounds.  Abdominal:     General: Bowel sounds are normal. There is no distension or abdominal bruit.     Palpations: Abdomen is soft. There is no hepatomegaly or splenomegaly.     Tenderness: There is no abdominal tenderness.  Musculoskeletal:     Right lower leg: No edema.     Left lower leg: No edema.  Lymphadenopathy:     Cervical: No cervical adenopathy.  Skin:    General: Skin is warm and dry.     Capillary Refill: Capillary refill takes less than 2 seconds.     Coloration: Skin is not jaundiced or pale.  Neurological:     General: No focal deficit present.     Mental Status: He is alert and oriented to person, place, and time.     Cranial Nerves: Cranial nerves are intact.     Sensory: Sensation is intact.     Motor: Motor function is intact.     Coordination: Impaired rapid alternating movements (slow movements, but able to complete).     Gait: Gait abnormal (slow, wide gait).     Deep Tendon Reflexes: Reflexes are normal and symmetric.  Psychiatric:        Attention and Perception: Attention and perception normal.        Mood and Affect: Mood is depressed. Affect is flat.        Speech: Speech normal.        Behavior: Behavior is slowed. Behavior is cooperative.          Thought Content: Thought content normal.        Cognition and Memory: Cognition and memory normal.        Judgment: Judgment normal.     Results for orders placed or performed in visit on 05/26/18  CMP14+EGFR  Result Value Ref Range   Glucose 86 65 - 99 mg/dL   BUN 10 6 - 24 mg/dL   Creatinine, Ser 0.96 0.76 - 1.27 mg/dL   GFR calc non Af Amer 88 >59  mL/min/1.73   GFR calc Af Amer 102 >59 mL/min/1.73   BUN/Creatinine Ratio 10 9 - 20   Sodium 143 134 - 144 mmol/L   Potassium 3.5 3.5 - 5.2 mmol/L   Chloride 98 96 - 106 mmol/L   CO2 25 20 - 29 mmol/L   Calcium 9.1 8.7 - 10.2 mg/dL   Total Protein 7.3 6.0 - 8.5 g/dL   Albumin 4.5 3.5 - 5.5 g/dL   Globulin, Total 2.8 1.5 - 4.5 g/dL   Albumin/Globulin Ratio 1.6 1.2 - 2.2   Bilirubin Total 0.7 0.0 - 1.2 mg/dL   Alkaline Phosphatase 66 39 - 117 IU/L   AST 56 (H) 0 - 40 IU/L   ALT 22 0 - 44 IU/L  TSH  Result Value Ref Range   TSH 3.550 0.450 - 4.500 uIU/mL  CBC with Differential  Result Value Ref Range   WBC 4.4 3.4 - 10.8 x10E3/uL   RBC 4.62 4.14 - 5.80 x10E6/uL   Hemoglobin 15.7 13.0 - 17.7 g/dL   Hematocrit 46.0 37.5 - 51.0 %   MCV 100 (H) 79 - 97 fL   MCH 34.0 (H) 26.6 - 33.0 pg   MCHC 34.1 31.5 - 35.7 g/dL   RDW 12.8 12.3 - 15.4 %   Platelets 254 150 - 450 x10E3/uL   Neutrophils 43 Not Estab. %   Lymphs 40 Not Estab. %   Monocytes 13 Not Estab. %   Eos 3 Not Estab. %   Basos 1 Not Estab. %   Neutrophils Absolute 1.9 1.4 - 7.0 x10E3/uL   Lymphocytes Absolute 1.8 0.7 - 3.1 x10E3/uL   Monocytes Absolute 0.6 0.1 - 0.9 x10E3/uL   EOS (ABSOLUTE) 0.1 0.0 - 0.4 x10E3/uL   Basophils Absolute 0.1 0.0 - 0.2 x10E3/uL   Immature Granulocytes 0 Not Estab. %   Immature Grans (Abs) 0.0 0.0 - 0.1 x10E3/uL  Brain natriuretic peptide  Result Value Ref Range   BNP 15.8 0.0 - 100.0 pg/mL       Pertinent labs & imaging results that were available during my care of the patient were reviewed by me and considered in my medical decision making.  Assessment & Plan:  Dangelo was seen today for hospitalization follow-up.  Diagnoses and all orders for this visit:  Hospital discharge follow-up Labs pending. Encouraged to seek counseling and an AA group. Information provided. Will refill regular medications today. Referral placed for psychiatry.   Essential hypertension, benign Stable,  continue below. Labs pending.  -     olmesartan (BENICAR) 40 MG tablet; Take 1 tablet (40 mg total) by mouth daily. (Needs to be seen before next refill) -     CMP14+EGFR -     Lipid panel -     TSH  Mixed hyperlipidemia Diet and exercise encouraged. Labs pending.  -     CMP14+EGFR       -     Lipid panel  Chronic gout of foot, unspecified cause, unspecified laterality Preventative allopurinol tolerated well.   No recent flares. No side effects from medications. Will continue below.  -     allopurinol (ZYLOPRIM) 100 MG tablet; Take 2 tablets (200 mg total) by mouth daily. (Needs to be seen before next refill)  Alcohol withdrawal syndrome without complication (HCC) 10 days sober. Depression, slight anxiety, and tremors present. No seizures, hallucinations, or confusion. Has been taking prescribed Librium only at night due to the need to work. Has not followed up with AA or counselor. Information provided on available services and encouraged pt to attend. Labs pending.  -     CMP14+EGFR -     Vitamin B12 -     Magnesium -     Folate -     Ambulatory referral to Psychiatry  Vitamin B deficiency Is taking repletion daily. Labs pending.  -     Vitamin B12 -     Folate  Hypomagnesemia Is taking repletion daily. Labs pending.  -     Magnesium  Pancytopenia ([place]) Pt denies abnormal bleeding. Has some bruising. No purpura or petechial rash. No bleeding gums. Labs pending.  -     CBC with Differential/Platelet  Depression, recurrent (HCC) Ongoing depression, previously self medicated with ETOH. 10 days sober. Has not attended AA or counseling since discharge from hospital. Encouraged pt to attend AA and to seek counseling. Pt reluctant and states he will think about it. Will initiate SSRI today and refer to psychiatry. Report any new or worsening symptoms. Medications as prescribed. Follow up in 1 month.  -     sertraline (ZOLOFT) 50 MG tablet; Take 1 tablet (50 mg total) by mouth daily. -      Ambulatory referral to Psychiatry     Continue all other maintenance medications.  Total time spent with patient 45 minutes.  Greater than 50% of encounter spent in coordination of care/counseling.   Follow up plan: Return in about 1 month (around 05/03/2019), or if symptoms worsen or fail to improve, for Depression .  Educational handout given for counselors in the area  The above assessment and management plan was discussed with the patient. The patient verbalized understanding of and has agreed to the management plan. Patient is aware to call the clinic if symptoms persist or worsen. Patient is aware when to return to the clinic for a follow-up visit. Patient educated on when it is appropriate to go to the emergency department.   Monia Pouch, FNP-C [facility] [phone]

## 2019-04-04 LAB — LIPID PANEL
Chol/HDL Ratio: 5.5 ratio — ABNORMAL HIGH (ref 0.0–5.0)
Cholesterol, Total: 233 mg/dL — ABNORMAL HIGH (ref 100–199)
HDL: 42 mg/dL (ref >39)
LDL Calculated: 159 mg/dL — ABNORMAL HIGH (ref 0–99)
Triglycerides: 158 mg/dL — ABNORMAL HIGH (ref 0–149)
VLDL Cholesterol Cal: 32 mg/dL (ref 5–40)

## 2019-04-04 LAB — CBC WITH DIFFERENTIAL/PLATELET
Basophils Absolute: 0.1 10*3/uL (ref 0.0–0.2)
Basos: 1 %
EOS (ABSOLUTE): 0.2 10*3/uL (ref 0.0–0.4)
Eos: 2 %
Hematocrit: 39.1 % (ref 37.5–51.0)
Hemoglobin: 12.9 g/dL — ABNORMAL LOW (ref 13.0–17.7)
Immature Grans (Abs): 0 10*3/uL (ref 0.0–0.1)
Immature Granulocytes: 1 %
Lymphocytes Absolute: 1.9 10*3/uL (ref 0.7–3.1)
Lymphs: 25 %
MCH: 34.4 pg — ABNORMAL HIGH (ref 26.6–33.0)
MCHC: 33 g/dL (ref 31.5–35.7)
MCV: 104 fL — ABNORMAL HIGH (ref 79–97)
Monocytes Absolute: 1 10*3/uL — ABNORMAL HIGH (ref 0.1–0.9)
Monocytes: 13 %
Neutrophils Absolute: 4.3 10*3/uL (ref 1.4–7.0)
Neutrophils: 58 %
Platelets: 450 10*3/uL (ref 150–450)
RBC: 3.75 x10E6/uL — ABNORMAL LOW (ref 4.14–5.80)
RDW: 12.2 % (ref 11.6–15.4)
WBC: 7.5 10*3/uL (ref 3.4–10.8)

## 2019-04-04 LAB — CMP14+EGFR
ALT: 40 IU/L (ref 0–44)
AST: 52 IU/L — ABNORMAL HIGH (ref 0–40)
Albumin/Globulin Ratio: 1.7 (ref 1.2–2.2)
Albumin: 4.1 g/dL (ref 3.8–4.9)
Alkaline Phosphatase: 66 IU/L (ref 39–117)
BUN/Creatinine Ratio: 12 (ref 9–20)
BUN: 14 mg/dL (ref 6–24)
Bilirubin Total: 0.6 mg/dL (ref 0.0–1.2)
CO2: 23 mmol/L (ref 20–29)
Calcium: 9.9 mg/dL (ref 8.7–10.2)
Chloride: 100 mmol/L (ref 96–106)
Creatinine, Ser: 1.13 mg/dL (ref 0.76–1.27)
GFR calc Af Amer: 84 mL/min/{1.73_m2} (ref >59)
GFR calc non Af Amer: 72 mL/min/{1.73_m2} (ref >59)
Globulin, Total: 2.4 g/dL (ref 1.5–4.5)
Glucose: 96 mg/dL (ref 65–99)
Potassium: 4.4 mmol/L (ref 3.5–5.2)
Sodium: 138 mmol/L (ref 134–144)
Total Protein: 6.5 g/dL (ref 6.0–8.5)

## 2019-04-04 LAB — TSH: TSH: 4.67 u[IU]/mL — ABNORMAL HIGH (ref 0.450–4.500)

## 2019-04-04 LAB — VITAMIN B12: Vitamin B-12: 869 pg/mL (ref 232–1245)

## 2019-04-04 LAB — MAGNESIUM: Magnesium: 1.9 mg/dL (ref 1.6–2.3)

## 2019-04-04 LAB — FOLATE: Folate: >20 ng/mL (ref >3.0)

## 2019-04-05 MED ORDER — ATORVASTATIN CALCIUM 20 MG PO TABS: 20.0000 mg | ORAL_TABLET | Freq: Every day | ORAL | 3 refills | Status: DC

## 2019-04-05 NOTE — Addendum Note (Signed)
Addended by: Sonny Masters on: 04/05/2019 12:44 PM   Modules accepted: Orders

## 2019-04-06 ENCOUNTER — Ambulatory Visit: Payer: BLUE CROSS/BLUE SHIELD | Admitting: Family Medicine

## 2019-04-10 ENCOUNTER — Ambulatory Visit: Payer: BC Managed Care – PPO | Admitting: Family Medicine

## 2019-04-19 ENCOUNTER — Encounter: Payer: Self-pay | Admitting: *Deleted

## 2019-04-25 ENCOUNTER — Other Ambulatory Visit: Payer: Self-pay | Admitting: Family Medicine

## 2019-04-25 DIAGNOSIS — F339 Major depressive disorder, recurrent, unspecified: Secondary | ICD-10-CM

## 2019-05-03 ENCOUNTER — Ambulatory Visit: Payer: BC Managed Care – PPO | Admitting: Family Medicine

## 2019-05-03 ENCOUNTER — Other Ambulatory Visit: Payer: Self-pay

## 2019-05-03 ENCOUNTER — Encounter: Payer: Self-pay | Admitting: Family Medicine

## 2019-05-03 VITALS — BP 137/88 | HR 69 | Temp 98.2°F | Ht 74.0 in | Wt 231.0 lb

## 2019-05-03 DIAGNOSIS — F411 Generalized anxiety disorder: Secondary | ICD-10-CM | POA: Diagnosis not present

## 2019-05-03 DIAGNOSIS — D61818 Other pancytopenia: Secondary | ICD-10-CM | POA: Diagnosis not present

## 2019-05-03 DIAGNOSIS — R7989 Other specified abnormal findings of blood chemistry: Secondary | ICD-10-CM | POA: Diagnosis not present

## 2019-05-03 DIAGNOSIS — R748 Abnormal levels of other serum enzymes: Secondary | ICD-10-CM | POA: Diagnosis not present

## 2019-05-03 DIAGNOSIS — F339 Major depressive disorder, recurrent, unspecified: Secondary | ICD-10-CM

## 2019-05-03 DIAGNOSIS — R6889 Other general symptoms and signs: Secondary | ICD-10-CM | POA: Diagnosis not present

## 2019-05-03 MED ORDER — SERTRALINE HCL 100 MG PO TABS: 100.0000 mg | ORAL_TABLET | Freq: Every day | ORAL | 3 refills | Status: DC

## 2019-05-03 NOTE — Patient Instructions (Signed)
If your symptoms worsen or you have thoughts of suicide/homicide, PLEASE SEEK IMMEDIATE MEDICAL ATTENTION.  You may always call the National Suicide Hotline.  This is available 24 hours a day, 7 days a week.  Their number is: 1-800-273-8255  Taking the medicine as directed and not missing any doses is one of the best things you can do to treat your depression.  Here are some things to keep in mind:  1) Side effects (stomach upset, some increased anxiety) may happen before you notice a benefit.  These side effects typically go away over time. 2) Changes to your dose of medicine or a change in medication all together is sometimes necessary 3) Most people need to be on medication at least 12 months 4) Many people will notice an improvement within two weeks but the full effect of the medication can take up to 4-6 weeks 5) Stopping the medication when you start feeling better often results in a return of symptoms 6) Never discontinue your medication without contacting a health care professional first.  Some medications require gradual discontinuation/ taper and can make you sick if you stop them abruptly.  If your symptoms worsen or you have thoughts of suicide/homicide, PLEASE SEEK IMMEDIATE MEDICAL ATTENTION.  You may always call:  National Suicide Hotline: 800-273-8255 [place] Crisis Line: 336-832-9700 Crisis Recovery in [place]: 800-939-5911   These are available 24 hours a day, 7 days a week.   

## 2019-05-03 NOTE — Progress Notes (Signed)
Subjective:  Patient ID: Anthony Chavez, male    DOB: 05-14-1961, 58 y.o.   MRN: [id number]  Chief Complaint:  Depression (1 mo follow up )   HPI: Anthony Chavez is a 58 y.o. male presenting on 05/03/2019 for Depression (1 mo follow up )  Pt presents today for follow up of anxiety and depression. 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 Pt states he did have "a few beers" on Father's Day. States he has not had any other alcohol. Pt states he has been taking the Zoloft as prescribed. States he still has significant anxiety, depression, and stress. No SI or HI. No insomnia. States he still does not want to attend AA meetings as he feels he does not need this. Refuses psychiatry referral. States he can mange this on his own. Pt states he has lost about 10 pounds over the last month without trying. Pt denies diaphoresis, night sweats, chills, fever, abnormal bleeding or bruising, hematochezia, or melena.   GAD 7 : Generalized Anxiety Score 05/03/2019  Nervous, Anxious, on Edge 1  Control/stop worrying 1  Worry too much - different things 1  Trouble relaxing 1  Restless 1  Easily annoyed or irritable 1  Afraid - awful might happen 0  Total GAD 7 Score 6    Depression screen [facility] 2/9 05/03/2019 04/03/2019 06/19/2018 05/26/2018 05/01/2018  Decreased Interest 2 3 0 0 0  Down, Depressed, Hopeless 2 1 0 0 0  PHQ - 2 Score 4 4 0 0 0  Altered sleeping 1 3 - - -  Tired, decreased energy 1 3 - - -  Change in appetite 1 0 - - -  Feeling bad or failure about yourself  0 1 - - -  Trouble concentrating 0 1 - - -  Moving slowly or fidgety/restless 0 3 - - -  Suicidal thoughts 0 0 - - -  PHQ-9 Score 7 15 - - -  Difficult doing work/chores Not difficult at all - - - -    Relevant past medical, surgical, family, and social history reviewed and updated as indicated.  Allergies and medications reviewed and updated.   Past Medical History:  Diagnosis Date  . Gout   .  Hypertension     Past Surgical History:  Procedure Laterality Date  . HERNIA REPAIR    . ring finger surgery Left   . TONSILLECTOMY      Social History   Socioeconomic History  . Marital status: Married    Spouse name: Not on file  . Number of children: Not on file  . Years of education: Not on file  . Highest education level: Not on file  Occupational History  . Occupation: Engineer, petroleum  Social Needs  . Financial resource strain: Not on file  . Food insecurity    Worry: Not on file    Inability: Not on file  . Transportation needs    Medical: Not on file    Non-medical: Not on file  Tobacco Use  . Smoking status: Never Smoker  . Smokeless tobacco: Never Used  Substance and Sexual Activity  . Alcohol use: Yes    Comment: 1 pint hard liquor per day  . Drug use: No  . Sexual activity: Not on file  Lifestyle  . Physical activity    Days per week: Not on file    Minutes per session: Not on file  . Stress: Not on file  Relationships  . Social Herbalist on phone: Not on file    Gets together: Not on file    Attends religious service: Not on file    Active member of club or organization: Not on file    Attends meetings of clubs or organizations: Not on file    Relationship status: Not on file  . Intimate partner violence    Fear of current or ex partner: Not on file    Emotionally abused: Not on file    Physically abused: Not on file    Forced sexual activity: Not on file  Other Topics Concern  . Not on file  Social History Narrative  . Not on file    Outpatient Encounter Medications as of 05/03/2019  Medication Sig  . allopurinol (ZYLOPRIM) 100 MG tablet Take 2 tablets (200 mg total) by mouth daily. (Needs to be seen before next refill)  . folic acid (FOLVITE) 1 MG tablet   . furosemide (LASIX) 20 MG tablet Take 1 tablet (20 mg total) by mouth daily as needed.  . magnesium oxide (MAG-OX) 400 (241.3 Mg) MG tablet   . Multiple Vitamin (THERA)  TABS   . olmesartan (BENICAR) 40 MG tablet Take 1 tablet (40 mg total) by mouth daily. (Needs to be seen before next refill)  . Thiamine HCl (VITAMIN B1) 100 MG TABS   . [DISCONTINUED] sertraline (ZOLOFT) 50 MG tablet TAKE 1 TABLET BY MOUTH EVERY DAY  . sertraline (ZOLOFT) 100 MG tablet Take 1 tablet (100 mg total) by mouth daily.  . [DISCONTINUED] atorvastatin (LIPITOR) 20 MG tablet Take 1 tablet (20 mg total) by mouth daily.  . [DISCONTINUED] chlordiazePOXIDE (LIBRIUM) 25 MG capsule   . [DISCONTINUED] potassium chloride SA (KLOR-CON M20) 20 MEQ tablet Take 1 tablet (20 mEq total) by mouth daily. When you take the lasix   No facility-administered encounter medications on file as of 05/03/2019.     No Known Allergies  Review of Systems  Constitutional: Positive for unexpected weight change. Negative for activity change, appetite change, chills, diaphoresis, fatigue and fever.  Eyes: Negative for photophobia and visual disturbance.  Respiratory: Negative for cough, chest tightness, shortness of breath and wheezing.   Cardiovascular: Negative for chest pain, palpitations and leg swelling.  Gastrointestinal: Negative for abdominal distention, abdominal pain, anal bleeding, blood in stool, constipation, diarrhea, nausea, rectal pain and vomiting.  Endocrine: Negative for cold intolerance and heat intolerance.  Genitourinary: Negative for decreased urine volume, difficulty urinating and hematuria.  Musculoskeletal: Negative for arthralgias and myalgias.  Skin: Negative for color change and pallor.  Neurological: Negative for dizziness, tremors, seizures, syncope, facial asymmetry, speech difficulty, weakness, light-headedness, numbness and headaches.  Hematological: Does not bruise/bleed easily.  Psychiatric/Behavioral: Positive for agitation, behavioral problems, decreased concentration and dysphoric mood. Negative for confusion, hallucinations, self-injury, sleep disturbance and suicidal ideas.  The patient is nervous/anxious. The patient is not hyperactive.   All other systems reviewed and are negative.       Objective:  BP 137/88   Pulse 69   Temp 98.2 F (36.8 C) (Oral)   Ht _0  (1.88 m)   Wt 231 lb (104.8 kg)   BMI 29.66 kg/m    Wt Readings from Last 3 Encounters:  05/03/19 231 lb (104.8 kg)  04/03/19 231 lb (104.8 kg)  06/19/18 241 lb (109.3 kg)    Physical Exam Vitals signs and nursing note reviewed.  Constitutional:      General: He is not in  acute distress.    Appearance: Normal appearance. He is well-developed and well-groomed. He is not ill-appearing, toxic-appearing or diaphoretic.  HENT:     Head: Normocephalic and atraumatic.     Jaw: There is normal jaw occlusion.     Right Ear: Hearing normal.     Left Ear: Hearing normal.     Nose: Nose normal.     Mouth/Throat:     Lips: Pink.     Mouth: Mucous membranes are moist.     Pharynx: Oropharynx is clear. Uvula midline.  Eyes:     General: Lids are normal.     Extraocular Movements: Extraocular movements intact.     Conjunctiva/sclera: Conjunctivae normal.     Pupils: Pupils are equal, round, and reactive to light.  Neck:     Musculoskeletal: Normal range of motion and neck supple.     Thyroid: No thyroid mass, thyromegaly or thyroid tenderness.     Vascular: No carotid bruit or JVD.     Trachea: Trachea and phonation normal.  Cardiovascular:     Rate and Rhythm: Normal rate and regular rhythm.     Chest Wall: PMI is not displaced.     Pulses: Normal pulses.     Heart sounds: Normal heart sounds. No murmur. No friction rub. No gallop.   Pulmonary:     Effort: Pulmonary effort is normal. No respiratory distress.     Breath sounds: Normal breath sounds. No wheezing.  Abdominal:     General: Bowel sounds are normal. There is no distension or abdominal bruit.     Palpations: Abdomen is soft. There is no hepatomegaly or splenomegaly.     Tenderness: There is no abdominal tenderness. There is  no right CVA tenderness or left CVA tenderness.     Hernia: No hernia is present.  Musculoskeletal: Normal range of motion.     Right lower leg: No edema.     Left lower leg: No edema.  Lymphadenopathy:     Cervical: No cervical adenopathy.  Skin:    General: Skin is warm and dry.     Capillary Refill: Capillary refill takes less than 2 seconds.     Coloration: Skin is not cyanotic, jaundiced or pale.     Findings: No rash.  Neurological:     General: No focal deficit present.     Mental Status: He is alert and oriented to person, place, and time.     Cranial Nerves: Cranial nerves are intact.     Sensory: Sensation is intact.     Motor: Motor function is intact.     Coordination: Coordination is intact.     Gait: Gait is intact.     Deep Tendon Reflexes: Reflexes are normal and symmetric.  Psychiatric:        Attention and Perception: Attention and perception normal.        Mood and Affect: Mood and affect normal.        Speech: Speech normal.        Behavior: Behavior normal. Behavior is cooperative.        Thought Content: Thought content normal.        Cognition and Memory: Cognition and memory normal.        Judgment: Judgment normal.     Results for orders placed or performed in visit on 04/03/19  CMP14+EGFR  Result Value Ref Range   Glucose 96 65 - 99 mg/dL   BUN 14 6 - 24 mg/dL   Creatinine,  Ser 1.13 0.76 - 1.27 mg/dL   GFR calc non Af Amer 72 >59 mL/min/1.73   GFR calc Af Amer 84 >59 mL/min/1.73   BUN/Creatinine Ratio 12 9 - 20   Sodium 138 134 - 144 mmol/L   Potassium 4.4 3.5 - 5.2 mmol/L   Chloride 100 96 - 106 mmol/L   CO2 23 20 - 29 mmol/L   Calcium 9.9 8.7 - 10.2 mg/dL   Total Protein 6.5 6.0 - 8.5 g/dL   Albumin 4.1 3.8 - 4.9 g/dL   Globulin, Total 2.4 1.5 - 4.5 g/dL   Albumin/Globulin Ratio 1.7 1.2 - 2.2   Bilirubin Total 0.6 0.0 - 1.2 mg/dL   Alkaline Phosphatase 66 39 - 117 IU/L   AST 52 (H) 0 - 40 IU/L   ALT 40 0 - 44 IU/L  CBC with  Differential/Platelet  Result Value Ref Range   WBC 7.5 3.4 - 10.8 x10E3/uL   RBC 3.75 (L) 4.14 - 5.80 x10E6/uL   Hemoglobin 12.9 (L) 13.0 - 17.7 g/dL   Hematocrit 39.1 37.5 - 51.0 %   MCV 104 (H) 79 - 97 fL   MCH 34.4 (H) 26.6 - 33.0 pg   MCHC 33.0 31.5 - 35.7 g/dL   RDW 12.2 11.6 - 15.4 %   Platelets 450 150 - 450 x10E3/uL   Neutrophils 58 Not Estab. %   Lymphs 25 Not Estab. %   Monocytes 13 Not Estab. %   Eos 2 Not Estab. %   Basos 1 Not Estab. %   Neutrophils Absolute 4.3 1.4 - 7.0 x10E3/uL   Lymphocytes Absolute 1.9 0.7 - 3.1 x10E3/uL   Monocytes Absolute 1.0 (H) 0.1 - 0.9 x10E3/uL   EOS (ABSOLUTE) 0.2 0.0 - 0.4 x10E3/uL   Basophils Absolute 0.1 0.0 - 0.2 x10E3/uL   Immature Granulocytes 1 Not Estab. %   Immature Grans (Abs) 0.0 0.0 - 0.1 x10E3/uL  Lipid panel  Result Value Ref Range   Cholesterol, Total 233 (H) 100 - 199 mg/dL   Triglycerides 158 (H) 0 - 149 mg/dL   HDL 42 >39 mg/dL   VLDL Cholesterol Cal 32 5 - 40 mg/dL   LDL Calculated 159 (H) 0 - 99 mg/dL   Chol/HDL Ratio 5.5 (H) 0.0 - 5.0 ratio  TSH  Result Value Ref Range   TSH 4.670 (H) 0.450 - 4.500 uIU/mL  Vitamin B12  Result Value Ref Range   Vitamin B-12 869 232 - 1,245 pg/mL  Magnesium  Result Value Ref Range   Magnesium 1.9 1.6 - 2.3 mg/dL  Folate  Result Value Ref Range   Folate >20.0 >3.0 ng/mL       Pertinent labs & imaging results that were available during my care of the patient were reviewed by me and considered in my medical decision making.  Assessment & Plan:  Maurie was seen today for depression.  Diagnoses and all orders for this visit:  Depression, recurrent ([place]) Generalized anxiety disorder Pt continues to have stress and anxiety despite Zoloft. Will increase dose today. Report any new or worsening symptoms. Medications as prescribed. Pt aware to continue to avoid alcohol. Follow up in 3 months or sooner if warranted.  -     sertraline (ZOLOFT) 100 MG tablet; Take 1 tablet (100 mg  total) by mouth daily. -     Thyroid Panel With TSH  Pancytopenia (HCC) No abnormal bruising or bleeding. Will recheck labs today and refer to hematology if warranted.  -  CBC with Differential/Platelet  Abnormal TSH Pt reports weight loss and increased anxiety. Will recheck labs today.  -     Thyroid Panel With TSH  Elevated liver enzymes Last AST elevated. No abdominal pain, jaundice, confusion, or weakness. Will recheck today.  -     CMP14+EGFR     Continue all other maintenance medications.  Follow up plan: Return in about 3 months (around 08/03/2019), or if symptoms worsen or fail to improve, for depression anxiety.  Educational handout given for depression  The above assessment and management plan was discussed with the patient. The patient verbalized understanding of and has agreed to the management plan. Patient is aware to call the clinic if symptoms persist or worsen. Patient is aware when to return to the clinic for a follow-up visit. Patient educated on when it is appropriate to go to the emergency department.   Monia Pouch, FNP-C [facility] [phone]

## 2019-05-04 LAB — THYROID PANEL WITH TSH
Free Thyroxine Index: 2 (ref 1.2–4.9)
T3 Uptake Ratio: 25 % (ref 24–39)
T4, Total: 8.1 ug/dL (ref 4.5–12.0)
TSH: 2.68 u[IU]/mL (ref 0.450–4.500)

## 2019-05-04 LAB — CBC WITH DIFFERENTIAL/PLATELET
Basophils Absolute: 0 10*3/uL (ref 0.0–0.2)
Basos: 1 %
EOS (ABSOLUTE): 0.1 10*3/uL (ref 0.0–0.4)
Eos: 2 %
Hematocrit: 37.8 % (ref 37.5–51.0)
Hemoglobin: 12.9 g/dL — ABNORMAL LOW (ref 13.0–17.7)
Immature Grans (Abs): 0 10*3/uL (ref 0.0–0.1)
Immature Granulocytes: 0 %
Lymphocytes Absolute: 2.2 10*3/uL (ref 0.7–3.1)
Lymphs: 34 %
MCH: 33 pg (ref 26.6–33.0)
MCHC: 34.1 g/dL (ref 31.5–35.7)
MCV: 97 fL (ref 79–97)
Monocytes Absolute: 0.7 10*3/uL (ref 0.1–0.9)
Monocytes: 11 %
Neutrophils Absolute: 3.3 10*3/uL (ref 1.4–7.0)
Neutrophils: 52 %
Platelets: 251 10*3/uL (ref 150–450)
RBC: 3.91 x10E6/uL — ABNORMAL LOW (ref 4.14–5.80)
RDW: 11.9 % (ref 11.6–15.4)
WBC: 6.4 10*3/uL (ref 3.4–10.8)

## 2019-05-04 LAB — CMP14+EGFR
ALT: 28 IU/L (ref 0–44)
AST: 49 IU/L — ABNORMAL HIGH (ref 0–40)
Albumin/Globulin Ratio: 1.9 (ref 1.2–2.2)
Albumin: 4.2 g/dL (ref 3.8–4.9)
Alkaline Phosphatase: 49 IU/L (ref 39–117)
BUN/Creatinine Ratio: 23 — ABNORMAL HIGH (ref 9–20)
BUN: 20 mg/dL (ref 6–24)
Bilirubin Total: 0.8 mg/dL (ref 0.0–1.2)
CO2: 23 mmol/L (ref 20–29)
Calcium: 9.6 mg/dL (ref 8.7–10.2)
Chloride: 106 mmol/L (ref 96–106)
Creatinine, Ser: 0.86 mg/dL (ref 0.76–1.27)
GFR calc Af Amer: 112 mL/min/{1.73_m2} (ref >59)
GFR calc non Af Amer: 97 mL/min/{1.73_m2} (ref >59)
Globulin, Total: 2.2 g/dL (ref 1.5–4.5)
Glucose: 94 mg/dL (ref 65–99)
Potassium: 4.2 mmol/L (ref 3.5–5.2)
Sodium: 144 mmol/L (ref 134–144)
Total Protein: 6.4 g/dL (ref 6.0–8.5)

## 2019-05-26 ENCOUNTER — Other Ambulatory Visit: Payer: Self-pay | Admitting: Family Medicine

## 2019-05-26 DIAGNOSIS — F411 Generalized anxiety disorder: Secondary | ICD-10-CM

## 2019-05-26 DIAGNOSIS — F339 Major depressive disorder, recurrent, unspecified: Secondary | ICD-10-CM

## 2019-06-26 ENCOUNTER — Other Ambulatory Visit: Payer: Self-pay | Admitting: Family Medicine

## 2019-06-26 DIAGNOSIS — I10 Essential (primary) hypertension: Secondary | ICD-10-CM

## 2019-06-26 DIAGNOSIS — M1A0790 Idiopathic chronic gout, unspecified ankle and foot, without tophus (tophi): Secondary | ICD-10-CM

## 2019-08-08 ENCOUNTER — Ambulatory Visit: Payer: BC Managed Care – PPO | Admitting: Family Medicine

## 2019-08-08 ENCOUNTER — Encounter: Payer: Self-pay | Admitting: Family Medicine

## 2019-08-21 ENCOUNTER — Other Ambulatory Visit: Payer: Self-pay | Admitting: Family Medicine

## 2019-08-21 DIAGNOSIS — F411 Generalized anxiety disorder: Secondary | ICD-10-CM

## 2019-08-21 DIAGNOSIS — F339 Major depressive disorder, recurrent, unspecified: Secondary | ICD-10-CM

## 2019-09-04 ENCOUNTER — Other Ambulatory Visit: Payer: Self-pay | Admitting: Family Medicine

## 2019-09-04 DIAGNOSIS — F411 Generalized anxiety disorder: Secondary | ICD-10-CM

## 2019-09-04 DIAGNOSIS — F339 Major depressive disorder, recurrent, unspecified: Secondary | ICD-10-CM

## 2019-10-25 ENCOUNTER — Telehealth: Payer: Self-pay | Admitting: Family Medicine

## 2019-10-25 ENCOUNTER — Encounter: Payer: Self-pay | Admitting: Family Medicine

## 2019-10-25 NOTE — Telephone Encounter (Signed)
Patient complains of head congestion x2 days. No other symptoms. Has been using Afrin and Alka-Selzter. I recommended Sudafed, Flonase, humidifier, Vicks, and COVID-19 testing. He will go to [facility] for testing.

## 2019-11-07 ENCOUNTER — Ambulatory Visit (INDEPENDENT_AMBULATORY_CARE_PROVIDER_SITE_OTHER): Payer: BC Managed Care – PPO | Admitting: Family Medicine

## 2019-11-07 ENCOUNTER — Other Ambulatory Visit: Payer: Self-pay

## 2019-11-07 ENCOUNTER — Encounter: Payer: Self-pay | Admitting: Family Medicine

## 2019-11-07 DIAGNOSIS — J019 Acute sinusitis, unspecified: Secondary | ICD-10-CM | POA: Diagnosis not present

## 2019-11-07 MED ORDER — PREDNISONE 10 MG (21) PO TBPK: ORAL_TABLET | ORAL | 0 refills | Status: DC

## 2019-11-07 MED ORDER — AMOXICILLIN-POT CLAVULANATE 875-125 MG PO TABS: 1.0000 | ORAL_TABLET | Freq: Two times a day (BID) | ORAL | 0 refills | Status: AC

## 2019-11-07 NOTE — Progress Notes (Signed)
Virtual Visit via Telephone Note  I connected with Anthony Chavez on 11/07/19 at 2:07 PM by telephone and verified that I am speaking with the correct person using two identifiers. Anthony Chavez is currently located at home and nobody is currently with him during this visit. The provider, Gwenlyn Fudge, FNP is located in their office at time of visit.  I discussed the limitations, risks, security and privacy concerns of performing an evaluation and management service by telephone and the availability of in person appointments. I also discussed with the patient that there may be a patient responsible charge related to this service. The patient expressed understanding and agreed to proceed.  Subjective: PCP: Sonny Masters, FNP  Chief Complaint  Patient presents with  . URI   Patient complains of cough, head congestion, runny nose, sore throat, ear pain/pressure, facial pain/pressure, postnasal drainage, shortness of breath and loss of smell. Onset of symptoms was 3 weeks ago, unchanged since that time. He is drinking plenty of fluids. Evaluation to date: none.  He is scheduled for COVID-19 testing tomorrow at 1 PM at [facility].  Treatment to date: nasal steroids, Nyquil, and alka selzter cold and flu. He does not have a history of allergies, asthma, or COPD. He does not smoke.    ROS: Per HPI  Current Outpatient Medications:  .  allopurinol (ZYLOPRIM) 100 MG tablet, TAKE 2 TABLETS (200 MG TOTAL) BY MOUTH DAILY. (NEEDS TO BE SEEN BEFORE NEXT REFILL), Disp: 180 tablet, Rfl: 1 .  folic acid (FOLVITE) 1 MG tablet, , Disp: , Rfl:  .  furosemide (LASIX) 20 MG tablet, Take 1 tablet (20 mg total) by mouth daily as needed., Disp: 90 tablet, Rfl: 1 .  magnesium oxide (MAG-OX) 400 (241.3 Mg) MG tablet, , Disp: , Rfl:  .  Multiple Vitamin (THERA) TABS, , Disp: , Rfl:  .  olmesartan (BENICAR) 40 MG tablet, TAKE 1 TABLET (40 MG TOTAL) BY MOUTH DAILY. (NEEDS TO BE SEEN BEFORE NEXT REFILL), Disp: 90  tablet, Rfl: 1 .  sertraline (ZOLOFT) 100 MG tablet, Take 1 tablet (100 mg total) by mouth daily. (Needs to be seen before next refill), Disp: 30 tablet, Rfl: 0 .  Thiamine HCl (VITAMIN B1) 100 MG TABS, , Disp: , Rfl:   No Known Allergies Past Medical History:  Diagnosis Date  . Gout   . Hypertension     Observations/Objective: A&O  No respiratory distress or wheezing audible over the phone Mood, judgement, and thought processes all WNL  Assessment and Plan: 1. Acute non-recurrent sinusitis, unspecified location - Education provided on sinusitis and COVID-19.  Advised patient that I was going to go ahead and send an antibiotic for him to start due to his symptoms and duration of illness but that if his Covid test came back positive he should stop the antibiotic.  Discussed symptom management. - amoxicillin-clavulanate (AUGMENTIN) 875-125 MG tablet; Take 1 tablet by mouth 2 (two) times daily for 7 days.  Dispense: 14 tablet; Refill: 0 - predniSONE (STERAPRED UNI-PAK 21 TAB) 10 MG (21) TBPK tablet; Use as directed on back of pill pack  Dispense: 21 tablet; Refill: 0   Follow Up Instructions:  I discussed the assessment and treatment plan with the patient. The patient was provided an opportunity to ask questions and all were answered. The patient agreed with the plan and demonstrated an understanding of the instructions.   The patient was advised to call back or seek an in-person evaluation if  the symptoms worsen or if the condition fails to improve as anticipated.  The above assessment and management plan was discussed with the patient. The patient verbalized understanding of and has agreed to the management plan. Patient is aware to call the clinic if symptoms persist or worsen. Patient is aware when to return to the clinic for a follow-up visit. Patient educated on when it is appropriate to go to the emergency department.   Time call ended: 2:14 PM  I provided 11 minutes of  non-face-to-face time during this encounter.  Hendricks Limes, MSN, APRN, FNP-C [facility] 11/07/19

## 2019-11-08 DIAGNOSIS — Z20828 Contact with and (suspected) exposure to other viral communicable diseases: Secondary | ICD-10-CM | POA: Diagnosis not present

## 2019-11-11 NOTE — Patient Instructions (Signed)
Sinusitis, Adult Sinusitis is soreness and swelling (inflammation) of your sinuses. Sinuses are hollow spaces in the bones around your face. They are located:  Around your eyes.  In the middle of your forehead.  Behind your nose.  In your cheekbones. Your sinuses and nasal passages are lined with a fluid called mucus. Mucus drains out of your sinuses. Swelling can trap mucus in your sinuses. This lets germs (bacteria, virus, or fungus) grow, which leads to infection. Most of the time, this condition is caused by a virus. What are the causes? This condition is caused by:  Allergies.  Asthma.  Germs.  Things that block your nose or sinuses.  Growths in the nose (nasal polyps).  Chemicals or irritants in the air.  Fungus (rare). What increases the risk? You are more likely to develop this condition if:  You have a weak body defense system (immune system).  You do a lot of swimming or diving.  You use nasal sprays too much.  You smoke. What are the signs or symptoms? The main symptoms of this condition are pain and a feeling of pressure around the sinuses. Other symptoms include:  Stuffy nose (congestion).  Runny nose (drainage).  Swelling and warmth in the sinuses.  Headache.  Toothache.  A cough that may get worse at night.  Mucus that collects in the throat or the back of the nose (postnasal drip).  Being unable to smell and taste.  Being very tired (fatigue).  A fever.  Sore throat.  Bad breath. How is this diagnosed? This condition is diagnosed based on:  Your symptoms.  Your medical history.  A physical exam.  Tests to find out if your condition is short-term (acute) or long-term (chronic). Your doctor may: ? Check your nose for growths (polyps). ? Check your sinuses using a tool that has a light (endoscope). ? Check for allergies or germs. ? Do imaging tests, such as an MRI or CT scan. How is this treated? Treatment for this condition  depends on the cause and whether it is short-term or long-term.  If caused by a virus, your symptoms should go away on their own within 10 days. You may be given medicines to relieve symptoms. They include: ? Medicines that shrink swollen tissue in the nose. ? Medicines that treat allergies (antihistamines). ? A spray that treats swelling of the nostrils. ? Rinses that help get rid of thick mucus in your nose (nasal saline washes).  If caused by bacteria, your doctor may wait to see if you will get better without treatment. You may be given antibiotic medicine if you have: ? A very bad infection. ? A weak body defense system.  If caused by growths in the nose, you may need to have surgery. Follow these instructions at home: Medicines  Take, use, or apply over-the-counter and prescription medicines only as told by your doctor. These may include nasal sprays.  If you were prescribed an antibiotic medicine, take it as told by your doctor. Do not stop taking the antibiotic even if you start to feel better. Hydrate and humidify   Drink enough water to keep your pee (urine) pale yellow.  Use a cool mist humidifier to keep the humidity level in your home above 50%.  Breathe in steam for 10-15 minutes, 3-4 times a day, or as told by your doctor. You can do this in the bathroom while a hot shower is running.  Try not to spend time in cool or dry air.   Rest  Rest as much as you can.  Sleep with your head raised (elevated).  Make sure you get enough sleep each night. General instructions   Put a warm, moist washcloth on your face 3-4 times a day, or as often as told by your doctor. This will help with discomfort.  Wash your hands often with soap and water. If there is no soap and water, use hand sanitizer.  Do not smoke. Avoid being around people who are smoking (secondhand smoke).  Keep all follow-up visits as told by your doctor. This is important. Contact a doctor if:  You  have a fever.  Your symptoms get worse.  Your symptoms do not get better within 10 days. Get help right away if:  You have a very bad headache.  You cannot stop throwing up (vomiting).  You have very bad pain or swelling around your face or eyes.  You have trouble seeing.  You feel confused.  Your neck is stiff.  You have trouble breathing. Summary  Sinusitis is swelling of your sinuses. Sinuses are hollow spaces in the bones around your face.  This condition is caused by tissues in your nose that become inflamed or swollen. This traps germs. These can lead to infection.  If you were prescribed an antibiotic medicine, take it as told by your doctor. Do not stop taking it even if you start to feel better.  Keep all follow-up visits as told by your doctor. This is important. This information is not intended to replace advice given to you by your health care provider. Make sure you discuss any questions you have with your health care provider. Document Revised: 03/20/2018 Document Reviewed: 03/20/2018 Elsevier Patient Education  2020 [employer]  Prevent the Spread of COVID-19 if You Are Sick If you are sick with COVID-19 or think you might have COVID-19, follow the steps below to care for yourself and to help protect other people in your home and community. Stay home except to get medical care.  Stay home. Most people with COVID-19 have mild illness and are able to recover at home without medical care. Do not leave your home, except to get medical care. Do not visit public areas.  Take care of yourself. Get rest and stay hydrated. Take over-the-counter medicines, such as acetaminophen, to help you feel better.  Stay in touch with your doctor. Call before you get medical care. Be sure to get care if you have trouble breathing, or have any other emergency warning signs, or if you think it is an emergency.  Avoid public transportation, ride-sharing, or taxis. Separate  yourself from other people and pets in your home.  As much as possible, stay in a specific room and away from other people and pets in your home. Also, you should use a separate bathroom, if available. If you need to be around other people or animals in or outside of the home, wear a mask. ? See COVID-19 and Animals if you have questions about pets:www.cdc.gov/coronavirus/2019-ncov/faq.html#COVID19animals. ? Additional guidance is available for those living in close quarters. (https://www.cdc.gov/coronavirus/2019-ncov/daily-life-coping/living-in-close-quarters.html) and shared housing (https://www.cdc.gov/coronavirus/2019-ncov/daily-life-coping/shared-housing/index.html). Monitor your symptoms.  Symptoms of COVID-19 include fever, cough, and shortness of breath but other symptoms may be present as well.  Follow care instructions from your healthcare provider and local health department. Your local health authorities will give instructions on checking your symptoms and reporting information. When to Seek Emergency Medical Attention Look for emergency warning signs* for COVID-19. If someone is showing any of these   signs, seek emergency medical care immediately:  Trouble breathing  Persistent pain or pressure in the chest  New confusion  Bluish lips or face  Inability to wake or stay awake *This list is not all possible symptoms. Please call your medical provider for any other symptoms that are severe or concerning to you. Call 911 or call ahead to your local emergency facility: Notify the operator that you are seeking care for someone who has or may have COVID-19. Call ahead before visiting your doctor.  Call ahead. Many medical visits for routine care are being postponed or done by phone or telemedicine.  If you have a medical appointment that cannot be postponed, call your doctor's office, and tell them you have or may have COVID-19. If you are sick, wear a mask over your nose and mouth.   You should wear a mask over your nose and mouth if you must be around other people or animals, including pets (even at home).  You don't need to wear the mask if you are alone. If you can't put on a mask (because of trouble breathing for example), cover your coughs and sneezes in some other way. Try to stay at least 6 feet away from other people. This will help protect the people around you.  Masks should not be placed on young children under age 2 years, anyone who has trouble breathing, or anyone who is not able to remove the mask without help. Note: During the COVID-19 pandemic, medical grade facemasks are reserved for healthcare workers and some first responders. You may need to make a mask using a scarf or bandana. Cover your coughs and sneezes.  Cover your mouth and nose with a tissue when you cough or sneeze.  Throw used tissues in a lined trash can.  Immediately wash your hands with soap and water for at least 20 seconds. If soap and water are not available, clean your hands with an alcohol-based hand sanitizer that contains at least 60% alcohol. Clean your hands often.  Wash your hands often with soap and water for at least 20 seconds. This is especially important after blowing your nose, coughing, or sneezing; going to the bathroom; and before eating or preparing food.  Use hand sanitizer if soap and water are not available. Use an alcohol-based hand sanitizer with at least 60% alcohol, covering all surfaces of your hands and rubbing them together until they feel dry.  Soap and water are the best option, especially if your hands are visibly dirty.  Avoid touching your eyes, nose, and mouth with unwashed hands. Avoid sharing personal household items.  Do not share dishes, drinking glasses, cups, eating utensils, towels, or bedding with other people in your home.  Wash these items thoroughly after using them with soap and water or put them in the dishwasher. Clean all  "high-touch" surfaces everyday.  Clean and disinfect high-touch surfaces in your "sick room" and bathroom. Let someone else clean and disinfect surfaces in common areas, but not your bedroom and bathroom.  If a caregiver or other person needs to clean and disinfect a sick person's bedroom or bathroom, they should do so on an as-needed basis. The caregiver/other person should wear a mask and wait as long as possible after the sick person has used the bathroom. High-touch surfaces include phones, remote controls, counters, tabletops, doorknobs, bathroom fixtures, toilets, keyboards, tablets, and bedside tables.  Clean and disinfect areas that may have blood, stool, or body fluids on them.  Use household   cleaners and disinfectants. Clean the area or item with soap and water or another detergent if it is dirty. Then use a household disinfectant. ? Be sure to follow the instructions on the label to ensure safe and effective use of the product. Many products recommend keeping the surface wet for several minutes to ensure germs are killed. Many also recommend precautions such as wearing gloves and making sure you have good ventilation during use of the product. ? Most EPA-registered household disinfectants should be effective. When you can be around others after you had or likely had COVID-19 When you can be around others (end home isolation) depends on different factors for different situations.  I think or know I had COVID-19, and I had symptoms ? You can be with others after  24 hours with no fever AND  Symptoms improved AND  10 days since symptoms first appeared ? Depending on your healthcare provider's advice and availability of testing, you might get tested to see if you still have COVID-19. If you will be tested, you can be around others when you have no fever, symptoms have improved, and you receive two negative test results in a row, at least 24 hours apart.  I tested positive for COVID-19  but had no symptoms ? If you continue to have no symptoms, you can be with others after:  10 days have passed since test ? Depending on your healthcare provider's advice and availability of testing, you might get tested to see if you still have COVID-19. If you will be tested, you can be around others after you receive two negative test results in a row, at least 24 hours apart. ? If you develop symptoms after testing positive, follow the guidance above for "I think or know I had COVID, and I had symptoms." cdc.gov/coronavirus 06/12/2019 This information is not intended to replace advice given to you by your health care provider. Make sure you discuss any questions you have with your health care provider. Document Revised: 06/28/2019 Document Reviewed: 05/01/2019 Elsevier Patient Education  2020 [employer] Inc.   

## 2019-12-18 DIAGNOSIS — N179 Acute kidney failure, unspecified: Secondary | ICD-10-CM | POA: Diagnosis not present

## 2019-12-18 DIAGNOSIS — R0902 Hypoxemia: Secondary | ICD-10-CM | POA: Diagnosis not present

## 2019-12-18 DIAGNOSIS — R918 Other nonspecific abnormal finding of lung field: Secondary | ICD-10-CM | POA: Diagnosis not present

## 2019-12-18 DIAGNOSIS — R55 Syncope and collapse: Secondary | ICD-10-CM | POA: Diagnosis not present

## 2019-12-18 DIAGNOSIS — F10230 Alcohol dependence with withdrawal, uncomplicated: Secondary | ICD-10-CM | POA: Diagnosis not present

## 2019-12-18 DIAGNOSIS — R42 Dizziness and giddiness: Secondary | ICD-10-CM | POA: Diagnosis not present

## 2019-12-18 DIAGNOSIS — I959 Hypotension, unspecified: Secondary | ICD-10-CM | POA: Diagnosis not present

## 2019-12-18 DIAGNOSIS — I10 Essential (primary) hypertension: Secondary | ICD-10-CM | POA: Diagnosis not present

## 2019-12-18 DIAGNOSIS — D61818 Other pancytopenia: Secondary | ICD-10-CM | POA: Diagnosis not present

## 2019-12-19 DIAGNOSIS — D61818 Other pancytopenia: Secondary | ICD-10-CM | POA: Diagnosis not present

## 2019-12-19 DIAGNOSIS — F10230 Alcohol dependence with withdrawal, uncomplicated: Secondary | ICD-10-CM | POA: Diagnosis not present

## 2019-12-19 DIAGNOSIS — N179 Acute kidney failure, unspecified: Secondary | ICD-10-CM | POA: Diagnosis not present

## 2019-12-19 DIAGNOSIS — R55 Syncope and collapse: Secondary | ICD-10-CM | POA: Diagnosis not present

## 2019-12-23 ENCOUNTER — Other Ambulatory Visit: Payer: Self-pay | Admitting: Family Medicine

## 2019-12-23 DIAGNOSIS — M1A0790 Idiopathic chronic gout, unspecified ankle and foot, without tophus (tophi): Secondary | ICD-10-CM

## 2019-12-23 DIAGNOSIS — I10 Essential (primary) hypertension: Secondary | ICD-10-CM

## 2020-01-11 ENCOUNTER — Other Ambulatory Visit: Payer: Self-pay | Admitting: *Deleted

## 2020-01-11 DIAGNOSIS — I10 Essential (primary) hypertension: Secondary | ICD-10-CM

## 2020-01-15 ENCOUNTER — Other Ambulatory Visit: Payer: Self-pay | Admitting: Family Medicine

## 2020-01-15 DIAGNOSIS — I10 Essential (primary) hypertension: Secondary | ICD-10-CM

## 2020-01-15 DIAGNOSIS — M1A0790 Idiopathic chronic gout, unspecified ankle and foot, without tophus (tophi): Secondary | ICD-10-CM

## 2020-01-15 NOTE — Telephone Encounter (Signed)
Left message to call back  

## 2020-01-15 NOTE — Telephone Encounter (Signed)
[facility]. NTBS by new provider 30 days given 12/24/19

## 2020-02-12 DIAGNOSIS — R571 Hypovolemic shock: Secondary | ICD-10-CM | POA: Diagnosis not present

## 2020-02-12 DIAGNOSIS — I714 Abdominal aortic aneurysm, without rupture: Secondary | ICD-10-CM | POA: Diagnosis not present

## 2020-02-12 DIAGNOSIS — J9601 Acute respiratory failure with hypoxia: Secondary | ICD-10-CM | POA: Diagnosis not present

## 2020-02-12 DIAGNOSIS — G9341 Metabolic encephalopathy: Secondary | ICD-10-CM | POA: Diagnosis not present

## 2020-02-12 DIAGNOSIS — N179 Acute kidney failure, unspecified: Secondary | ICD-10-CM | POA: Diagnosis not present

## 2020-02-12 DIAGNOSIS — J690 Pneumonitis due to inhalation of food and vomit: Secondary | ICD-10-CM | POA: Diagnosis not present

## 2020-02-12 DIAGNOSIS — E872 Acidosis: Secondary | ICD-10-CM | POA: Diagnosis not present

## 2020-02-12 DIAGNOSIS — T424X2A Poisoning by benzodiazepines, intentional self-harm, initial encounter: Secondary | ICD-10-CM | POA: Diagnosis not present

## 2020-02-12 DIAGNOSIS — F10239 Alcohol dependence with withdrawal, unspecified: Secondary | ICD-10-CM | POA: Diagnosis not present

## 2020-02-12 DIAGNOSIS — R778 Other specified abnormalities of plasma proteins: Secondary | ICD-10-CM | POA: Diagnosis not present

## 2020-02-12 DIAGNOSIS — I21A1 Myocardial infarction type 2: Secondary | ICD-10-CM | POA: Diagnosis not present

## 2020-02-12 DIAGNOSIS — Z4659 Encounter for fitting and adjustment of other gastrointestinal appliance and device: Secondary | ICD-10-CM | POA: Diagnosis not present

## 2020-02-12 DIAGNOSIS — R918 Other nonspecific abnormal finding of lung field: Secondary | ICD-10-CM | POA: Diagnosis not present

## 2020-02-12 DIAGNOSIS — R4182 Altered mental status, unspecified: Secondary | ICD-10-CM | POA: Diagnosis not present

## 2020-02-12 DIAGNOSIS — J189 Pneumonia, unspecified organism: Secondary | ICD-10-CM | POA: Diagnosis not present

## 2020-02-12 DIAGNOSIS — F10129 Alcohol abuse with intoxication, unspecified: Secondary | ICD-10-CM | POA: Diagnosis not present

## 2020-02-12 DIAGNOSIS — G92 Toxic encephalopathy: Secondary | ICD-10-CM | POA: Diagnosis not present

## 2020-02-12 DIAGNOSIS — F329 Major depressive disorder, single episode, unspecified: Secondary | ICD-10-CM | POA: Diagnosis not present

## 2020-02-12 DIAGNOSIS — D61811 Other drug-induced pancytopenia: Secondary | ICD-10-CM | POA: Diagnosis not present

## 2020-02-12 DIAGNOSIS — E870 Hyperosmolality and hypernatremia: Secondary | ICD-10-CM | POA: Diagnosis not present

## 2020-02-12 DIAGNOSIS — R0602 Shortness of breath: Secondary | ICD-10-CM | POA: Diagnosis not present

## 2020-02-12 DIAGNOSIS — J9600 Acute respiratory failure, unspecified whether with hypoxia or hypercapnia: Secondary | ICD-10-CM | POA: Diagnosis not present

## 2020-02-12 DIAGNOSIS — I959 Hypotension, unspecified: Secondary | ICD-10-CM | POA: Diagnosis not present

## 2020-02-12 DIAGNOSIS — I7102 Dissection of abdominal aorta: Secondary | ICD-10-CM | POA: Diagnosis not present

## 2020-02-12 DIAGNOSIS — R0902 Hypoxemia: Secondary | ICD-10-CM | POA: Diagnosis not present

## 2020-02-12 DIAGNOSIS — F1024 Alcohol dependence with alcohol-induced mood disorder: Secondary | ICD-10-CM | POA: Diagnosis not present

## 2020-02-12 DIAGNOSIS — R4020 Unspecified coma: Secondary | ICD-10-CM | POA: Diagnosis not present

## 2020-02-12 DIAGNOSIS — R579 Shock, unspecified: Secondary | ICD-10-CM | POA: Diagnosis not present

## 2020-02-12 DIAGNOSIS — J810 Acute pulmonary edema: Secondary | ICD-10-CM | POA: Diagnosis not present

## 2020-02-12 DIAGNOSIS — Z781 Physical restraint status: Secondary | ICD-10-CM | POA: Diagnosis not present

## 2020-02-12 DIAGNOSIS — J9602 Acute respiratory failure with hypercapnia: Secondary | ICD-10-CM | POA: Diagnosis not present

## 2020-02-12 DIAGNOSIS — Z20822 Contact with and (suspected) exposure to covid-19: Secondary | ICD-10-CM | POA: Diagnosis not present

## 2020-02-12 DIAGNOSIS — I469 Cardiac arrest, cause unspecified: Secondary | ICD-10-CM | POA: Diagnosis not present

## 2020-02-13 DIAGNOSIS — G9341 Metabolic encephalopathy: Secondary | ICD-10-CM | POA: Diagnosis not present

## 2020-02-13 DIAGNOSIS — J9601 Acute respiratory failure with hypoxia: Secondary | ICD-10-CM | POA: Diagnosis not present

## 2020-02-13 DIAGNOSIS — R0602 Shortness of breath: Secondary | ICD-10-CM | POA: Diagnosis not present

## 2020-02-13 DIAGNOSIS — J9602 Acute respiratory failure with hypercapnia: Secondary | ICD-10-CM | POA: Diagnosis not present

## 2020-02-13 DIAGNOSIS — F10129 Alcohol abuse with intoxication, unspecified: Secondary | ICD-10-CM | POA: Diagnosis not present

## 2020-02-13 DIAGNOSIS — R778 Other specified abnormalities of plasma proteins: Secondary | ICD-10-CM | POA: Diagnosis not present

## 2020-02-13 DIAGNOSIS — Z4659 Encounter for fitting and adjustment of other gastrointestinal appliance and device: Secondary | ICD-10-CM | POA: Diagnosis not present

## 2020-02-14 DIAGNOSIS — J189 Pneumonia, unspecified organism: Secondary | ICD-10-CM | POA: Diagnosis not present

## 2020-02-14 DIAGNOSIS — F1024 Alcohol dependence with alcohol-induced mood disorder: Secondary | ICD-10-CM | POA: Diagnosis not present

## 2020-02-14 DIAGNOSIS — F10239 Alcohol dependence with withdrawal, unspecified: Secondary | ICD-10-CM | POA: Diagnosis not present

## 2020-02-14 DIAGNOSIS — F329 Major depressive disorder, single episode, unspecified: Secondary | ICD-10-CM | POA: Diagnosis not present

## 2020-02-14 DIAGNOSIS — J9601 Acute respiratory failure with hypoxia: Secondary | ICD-10-CM | POA: Diagnosis not present

## 2020-02-14 DIAGNOSIS — G9341 Metabolic encephalopathy: Secondary | ICD-10-CM | POA: Diagnosis not present

## 2020-02-15 DIAGNOSIS — G9341 Metabolic encephalopathy: Secondary | ICD-10-CM | POA: Diagnosis not present

## 2020-02-15 DIAGNOSIS — F10239 Alcohol dependence with withdrawal, unspecified: Secondary | ICD-10-CM | POA: Diagnosis not present

## 2020-02-15 DIAGNOSIS — F329 Major depressive disorder, single episode, unspecified: Secondary | ICD-10-CM | POA: Diagnosis not present

## 2020-02-15 DIAGNOSIS — J9601 Acute respiratory failure with hypoxia: Secondary | ICD-10-CM | POA: Diagnosis not present

## 2020-02-15 DIAGNOSIS — F1024 Alcohol dependence with alcohol-induced mood disorder: Secondary | ICD-10-CM | POA: Diagnosis not present

## 2020-02-15 DIAGNOSIS — J189 Pneumonia, unspecified organism: Secondary | ICD-10-CM | POA: Diagnosis not present

## 2020-02-18 DIAGNOSIS — S2242XA Multiple fractures of ribs, left side, initial encounter for closed fracture: Secondary | ICD-10-CM | POA: Diagnosis not present

## 2020-02-18 DIAGNOSIS — J189 Pneumonia, unspecified organism: Secondary | ICD-10-CM | POA: Diagnosis not present

## 2020-02-18 DIAGNOSIS — I10 Essential (primary) hypertension: Secondary | ICD-10-CM | POA: Diagnosis not present

## 2020-02-18 DIAGNOSIS — I714 Abdominal aortic aneurysm, without rupture: Secondary | ICD-10-CM | POA: Diagnosis not present

## 2020-02-20 ENCOUNTER — Telehealth: Payer: Self-pay | Admitting: Family Medicine

## 2020-02-20 DIAGNOSIS — Z79899 Other long term (current) drug therapy: Secondary | ICD-10-CM | POA: Diagnosis not present

## 2020-02-20 DIAGNOSIS — F1120 Opioid dependence, uncomplicated: Secondary | ICD-10-CM | POA: Diagnosis not present

## 2020-02-20 NOTE — Telephone Encounter (Signed)
  Prescription Request  02/20/2020  What is the name of the medication or equipment? Amolodipine & any other due to be refilled  Have you contacted your pharmacy to request a refill? (if applicable) No  Which pharmacy would you like this sent to? [facility] [place] near [facility]   Patient notified that their request is being sent to the clinical staff for review and that they should receive a response within 2 business days.   Pt is having treatment & needs refilled in [place] per wife.  MMM's pt

## 2020-02-20 NOTE — Telephone Encounter (Signed)
Pt informed NTBS Last OV 05/03/19, Pt not wanting to make appt at this time 30 days was given with 12/24/19 and call was made on 01/15/20 with refill request

## 2020-02-26 DIAGNOSIS — F1120 Opioid dependence, uncomplicated: Secondary | ICD-10-CM | POA: Diagnosis not present

## 2020-02-26 DIAGNOSIS — Z79899 Other long term (current) drug therapy: Secondary | ICD-10-CM | POA: Diagnosis not present

## 2020-02-29 DIAGNOSIS — Z79899 Other long term (current) drug therapy: Secondary | ICD-10-CM | POA: Diagnosis not present

## 2020-02-29 DIAGNOSIS — F1120 Opioid dependence, uncomplicated: Secondary | ICD-10-CM | POA: Diagnosis not present

## 2020-03-04 DIAGNOSIS — Z79899 Other long term (current) drug therapy: Secondary | ICD-10-CM | POA: Diagnosis not present

## 2020-03-04 DIAGNOSIS — F1120 Opioid dependence, uncomplicated: Secondary | ICD-10-CM | POA: Diagnosis not present

## 2020-03-07 DIAGNOSIS — Z79899 Other long term (current) drug therapy: Secondary | ICD-10-CM | POA: Diagnosis not present

## 2020-03-07 DIAGNOSIS — F1120 Opioid dependence, uncomplicated: Secondary | ICD-10-CM | POA: Diagnosis not present

## 2020-03-11 DIAGNOSIS — F1120 Opioid dependence, uncomplicated: Secondary | ICD-10-CM | POA: Diagnosis not present

## 2020-03-11 DIAGNOSIS — Z79899 Other long term (current) drug therapy: Secondary | ICD-10-CM | POA: Diagnosis not present

## 2020-03-13 DIAGNOSIS — F1120 Opioid dependence, uncomplicated: Secondary | ICD-10-CM | POA: Diagnosis not present

## 2020-03-13 DIAGNOSIS — Z79899 Other long term (current) drug therapy: Secondary | ICD-10-CM | POA: Diagnosis not present

## 2020-03-18 DIAGNOSIS — F1120 Opioid dependence, uncomplicated: Secondary | ICD-10-CM | POA: Diagnosis not present

## 2020-03-18 DIAGNOSIS — Z79899 Other long term (current) drug therapy: Secondary | ICD-10-CM | POA: Diagnosis not present

## 2020-03-21 DIAGNOSIS — Z79899 Other long term (current) drug therapy: Secondary | ICD-10-CM | POA: Diagnosis not present

## 2020-03-21 DIAGNOSIS — F1120 Opioid dependence, uncomplicated: Secondary | ICD-10-CM | POA: Diagnosis not present

## 2020-03-25 DIAGNOSIS — F1120 Opioid dependence, uncomplicated: Secondary | ICD-10-CM | POA: Diagnosis not present

## 2020-03-25 DIAGNOSIS — Z79899 Other long term (current) drug therapy: Secondary | ICD-10-CM | POA: Diagnosis not present

## 2020-03-28 DIAGNOSIS — Z79899 Other long term (current) drug therapy: Secondary | ICD-10-CM | POA: Diagnosis not present

## 2020-03-28 DIAGNOSIS — Z5181 Encounter for therapeutic drug level monitoring: Secondary | ICD-10-CM | POA: Diagnosis not present

## 2020-04-02 ENCOUNTER — Telehealth: Payer: Self-pay | Admitting: Nurse Practitioner

## 2020-04-02 NOTE — Telephone Encounter (Signed)
Anthony Chavez wants to talk to [facility] only--about counseling for alcoholism. 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 Pt just got out of 45 day rehab treatment after an overdose. I offered apt for 04/24/2020 2:15 30 Min with MMM spouse took the apt. She still wants to talk to MMM before the apt. Spouse aware that MMM is not her today and will be back tomorrow. Pt was seeing [facility] but spouse wants her to establish with MMM.

## 2020-04-16 ENCOUNTER — Ambulatory Visit (INDEPENDENT_AMBULATORY_CARE_PROVIDER_SITE_OTHER): Payer: BC Managed Care – PPO | Admitting: Nurse Practitioner

## 2020-04-16 ENCOUNTER — Encounter: Payer: Self-pay | Admitting: Nurse Practitioner

## 2020-04-16 ENCOUNTER — Other Ambulatory Visit: Payer: Self-pay

## 2020-04-16 ENCOUNTER — Ambulatory Visit: Payer: Self-pay | Admitting: Nurse Practitioner

## 2020-04-16 VITALS — BP 130/83 | HR 75 | Temp 98.0°F | Resp 20 | Ht 74.0 in | Wt 243.0 lb

## 2020-04-16 DIAGNOSIS — E782 Mixed hyperlipidemia: Secondary | ICD-10-CM

## 2020-04-16 DIAGNOSIS — F1020 Alcohol dependence, uncomplicated: Secondary | ICD-10-CM

## 2020-04-16 DIAGNOSIS — M2550 Pain in unspecified joint: Secondary | ICD-10-CM | POA: Diagnosis not present

## 2020-04-16 DIAGNOSIS — I10 Essential (primary) hypertension: Secondary | ICD-10-CM

## 2020-04-16 DIAGNOSIS — F339 Major depressive disorder, recurrent, unspecified: Secondary | ICD-10-CM | POA: Diagnosis not present

## 2020-04-16 MED ORDER — LOSARTAN POTASSIUM 25 MG PO TABS: 25.0000 mg | ORAL_TABLET | Freq: Every day | ORAL | 1 refills | Status: DC

## 2020-04-16 MED ORDER — HYDRALAZINE HCL 25 MG PO TABS: 25.0000 mg | ORAL_TABLET | Freq: Two times a day (BID) | ORAL | 1 refills | Status: DC

## 2020-04-16 NOTE — Patient Instructions (Signed)
Stress, Adult Stress is a normal reaction to life events. Stress is what you feel when life demands more than you are used to, or more than you think you can handle. Some stress can be useful, such as studying for a test or meeting a deadline at work. Stress that occurs too often or for too long can cause problems. It can affect your emotional health and interfere with relationships and normal daily activities. Too much stress can weaken your body's defense system (immune system) and increase your risk for physical illness. If you already have a medical problem, stress can make it worse. What are the causes? All sorts of life events can cause stress. An event that causes stress for one person may not be stressful for another person. Major life events, whether positive or negative, commonly cause stress. Examples include:  Losing a job or starting a new job.  Losing a loved one.  Moving to a new town or home.  Getting married or divorced.  Having a baby.  Getting injured or sick. Less obvious life events can also cause stress, especially if they occur day after day or in combination with each other. Examples include:  Working long hours.  Driving in traffic.  Caring for children.  Being in debt.  Being in a difficult relationship. What are the signs or symptoms? Stress can cause emotional symptoms, including:  Anxiety. This is feeling worried, afraid, on edge, overwhelmed, or out of control.  Anger, including irritation or impatience.  Depression. This is feeling sad, down, helpless, or guilty.  Trouble focusing, remembering, or making decisions. Stress can cause physical symptoms, including:  Aches and pains. These may affect your head, neck, back, stomach, or other areas of your body.  Tight muscles or a clenched jaw.  Low energy.  Trouble sleeping. Stress can cause unhealthy behaviors, including:  Eating to feel better (overeating) or skipping meals.  Working too  much or putting off tasks.  Smoking, drinking alcohol, or using drugs to feel better. How is this diagnosed? Stress is diagnosed through an assessment by your health care provider. He or she may diagnose this condition based on:  Your symptoms and any stressful life events.  Your medical history.  Tests to rule out other causes of your symptoms. Depending on your condition, your health care provider may refer you to a specialist for further evaluation. How is this treated?  Stress management techniques are the recommended treatment for stress. Medicine is not typically recommended for the treatment of stress. Techniques to reduce your reaction to stressful life events include:  Stress identification. Monitor yourself for symptoms of stress and identify what causes stress for you. These skills may help you to avoid or prepare for stressful events.  Time management. Set your priorities, keep a calendar of events, and learn to say no. Taking these actions can help you avoid making too many commitments. Techniques for coping with stress include:  Rethinking the problem. Try to think realistically about stressful events rather than ignoring them or overreacting. Try to find the positives in a stressful situation rather than focusing on the negatives.  Exercise. Physical exercise can release both physical and emotional tension. The key is to find a form of exercise that you enjoy and do it regularly.  Relaxation techniques. These relax the body and mind. The key is to find one or more that you enjoy and use the techniques regularly. Examples include: ? Meditation, deep breathing, or progressive relaxation techniques. ? Yoga or   tai chi. ? Biofeedback, mindfulness techniques, or journaling. ? Listening to music, being out in nature, or participating in other hobbies.  Practicing a healthy lifestyle. Eat a balanced diet, drink plenty of water, limit or avoid caffeine, and get plenty of  sleep.  Having a strong support network. Spend time with family, friends, or other people you enjoy being around. Express your feelings and talk things over with someone you trust. Counseling or talk therapy with a mental health professional may be helpful if you are having trouble managing stress on your own. Follow these instructions at home: Lifestyle   Avoid drugs.  Do not use any products that contain nicotine or tobacco, such as cigarettes, e-cigarettes, and chewing tobacco. If you need help quitting, ask your health care provider.  Limit alcohol intake to no more than 1 drink a day for nonpregnant women and 2 drinks a day for men. One drink equals 12 oz of beer, 5 oz of wine, or 1 oz of hard liquor  Do not use alcohol or drugs to relax.  Eat a balanced diet that includes fresh fruits and vegetables, whole grains, lean meats, fish, eggs, and beans, and low-fat dairy. Avoid processed foods and foods high in added fat, sugar, and salt.  Exercise at least 30 minutes on 5 or more days each week.  Get 7-8 hours of sleep each night. General instructions   Practice stress management techniques as discussed with your health care provider.  Drink enough fluid to keep your urine clear or pale yellow.  Take over-the-counter and prescription medicines only as told by your health care provider.  Keep all follow-up visits as told by your health care provider. This is important. Contact a health care provider if:  Your symptoms get worse.  You have new symptoms.  You feel overwhelmed by your problems and can no longer manage them on your own. Get help right away if:  You have thoughts of hurting yourself or others. If you ever feel like you may hurt yourself or others, or have thoughts about taking your own life, get help right away. You can go to your nearest emergency department or call:  Your local emergency services (911 in the [place]  A suicide crisis helpline, such as the  [place] at (316) 250-6172. This is open 24 hours a day. Summary  Stress is a normal reaction to life events. It can cause problems if it happens too often or for too long.  Practicing stress management techniques is the best way to treat stress.  Counseling or talk therapy with a mental health professional may be helpful if you are having trouble managing stress on your own. This information is not intended to replace advice given to you by your health care provider. Make sure you discuss any questions you have with your health care provider. Document Revised: 05/18/2019 Document Reviewed: 12/08/2016 Elsevier Patient Education  [place].

## 2020-04-16 NOTE — Progress Notes (Signed)
Subjective:    Patient ID: JIMEL MYLER, male    DOB: 1961/03/13, 59 y.o.   MRN: [id number]   Chief Complaint: medical management of chronic issues     HPI:  1. Essential hypertension, benign No c/o chest pain, sob or headache. Does check blood pressure at home BP Readings from Last 3 Encounters:  04/16/20 130/83  05/03/19 137/88  04/03/19 127/85     2. Mixed hyperlipidemia Does watch diet and does some exercise. Lab Results  Component Value Date   CHOL 233 (H) 04/03/2019   HDL 42 04/03/2019   LDLCALC 159 (H) 04/03/2019   TRIG 158 (H) 04/03/2019   CHOLHDL 5.5 (H) 04/03/2019     3. Depression, recurrent ([place]) He does not feel depressed. Depression screen [facility] 2/9 04/16/2020 05/03/2019 04/03/2019  Decreased Interest 0 2 3  Down, Depressed, Hopeless 0 2 1  PHQ - 2 Score 0 4 4  Altered sleeping - 1 3  Tired, decreased energy - 1 3  Change in appetite - 1 0  Feeling bad or failure about yourself  - 0 1  Trouble concentrating - 0 1  Moving slowly or fidgety/restless - 0 3  Suicidal thoughts - 0 0  PHQ-9 Score - 7 15  Difficult doing work/chores - Not difficult at all -     4. 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 He feels real stronger. He is on day 67 clean. He occasionally has  Cravings but is doing well. Is doing zoom meetings with rehab. He works from [employer] and then comes straight home.    Outpatient Encounter Medications as of 04/16/2020  Medication Sig  . allopurinol (ZYLOPRIM) 100 MG tablet TAKE 2 TABLETS (200 MG TOTAL) BY MOUTH DAILY. (NEEDS TO BE SEEN BEFORE NEXT REFILL)  . folic acid (FOLVITE) 1 MG tablet Take 1 mg by mouth every morning.  . hydrALAZINE (APRESOLINE) 25 MG tablet Take 25 mg by mouth in the morning and at bedtime.   Marland Kitchen losartan (COZAAR) 25 MG tablet Take 25 mg by mouth at bedtime.  . magnesium oxide (MAG-OX) 400 (241.3 Mg) MG tablet Take 1 tablet by mouth 2 (two) times daily.  . Melatonin 10 MG CAPS Take 1  capsule by mouth at bedtime.  . thiamine 100 MG tablet Take 100 mg by mouth daily.     Past Surgical History:  Procedure Laterality Date  . HERNIA REPAIR    . ring finger surgery Left   . TONSILLECTOMY      No family history on file.  New complaints: Body aches- all over  Social history: livew with wife and son  Controlled substance contract: n/a    Review of Systems  Constitutional: Negative for diaphoresis.  Eyes: Negative for pain.  Respiratory: Negative for shortness of breath.   Cardiovascular: Negative for chest pain, palpitations and leg swelling.  Gastrointestinal: Negative for abdominal pain.  Endocrine: Negative for polydipsia.  Skin: Negative for rash.  Neurological: Negative for dizziness, weakness and headaches.  Hematological: Does not bruise/bleed easily.  All other systems reviewed and are negative.      Objective:   Physical Exam Vitals and nursing note reviewed.  Constitutional:      Appearance: Normal appearance.  Cardiovascular:     Rate and Rhythm: Normal rate and regular rhythm.     Heart sounds: Normal heart sounds.  Pulmonary:     Breath sounds: Normal breath sounds.  Musculoskeletal:  General: Normal range of motion.  Skin:    General: Skin is warm.  Neurological:     General: No focal deficit present.     Mental Status: He is alert and oriented to person, place, and time.  Psychiatric:        Mood and Affect: Mood normal.        Behavior: Behavior normal.    BP 130/83   Pulse 75   Temp 98 F (36.7 C) (Temporal)   Resp 20   Ht _0  (1.88 m)   Wt 243 lb (110.2 kg)   BMI 31.20 kg/m         Assessment & Plan:  AROLDO GALLI comes in today with chief complaint of No chief complaint on file.   Diagnosis and orders addressed:  1. Essential hypertension, benign *low sodium diet** - losartan (COZAAR) 25 MG tablet; Take 1 tablet (25 mg total) by mouth at bedtime.  Dispense: 90 tablet; Refill: 1 - hydrALAZINE  (APRESOLINE) 25 MG tablet; Take 1 tablet (25 mg total) by mouth in the morning and at bedtime.  Dispense: 180 tablet; Refill: 1 - CMP14+EGFR  2. Mixed hyperlipidemia Low fat diet - Lipid panel  3. Depression, recurrent ([place]) Stress management  4. Alcoholic ([place]) Encouraged AA or continue zoom meetings with rehab  5. Arthralgia, unspecified joint motirn OTC as needed - Arthritis Panel   Labs pending Health Maintenance reviewed Diet and exercise encouraged  Follow up plan: 6 months   [place], FNP

## 2020-04-17 LAB — CMP14+EGFR
ALT: 15 IU/L (ref 0–44)
AST: 15 IU/L (ref 0–40)
Albumin/Globulin Ratio: 1.8 (ref 1.2–2.2)
Albumin: 4.6 g/dL (ref 3.8–4.9)
Alkaline Phosphatase: 60 IU/L (ref 48–121)
BUN/Creatinine Ratio: 21 — ABNORMAL HIGH (ref 9–20)
BUN: 24 mg/dL (ref 6–24)
Bilirubin Total: 0.6 mg/dL (ref 0.0–1.2)
CO2: 22 mmol/L (ref 20–29)
Calcium: 9.8 mg/dL (ref 8.7–10.2)
Chloride: 101 mmol/L (ref 96–106)
Creatinine, Ser: 1.16 mg/dL (ref 0.76–1.27)
GFR calc Af Amer: 80 mL/min/{1.73_m2} (ref >59)
GFR calc non Af Amer: 70 mL/min/{1.73_m2} (ref >59)
Globulin, Total: 2.6 g/dL (ref 1.5–4.5)
Glucose: 100 mg/dL — ABNORMAL HIGH (ref 65–99)
Potassium: 4.6 mmol/L (ref 3.5–5.2)
Sodium: 141 mmol/L (ref 134–144)
Total Protein: 7.2 g/dL (ref 6.0–8.5)

## 2020-04-17 LAB — LIPID PANEL
Chol/HDL Ratio: 6.7 ratio — ABNORMAL HIGH (ref 0.0–5.0)
Cholesterol, Total: 254 mg/dL — ABNORMAL HIGH (ref 100–199)
HDL: 38 mg/dL — ABNORMAL LOW (ref >39)
LDL Chol Calc (NIH): 175 mg/dL — ABNORMAL HIGH (ref 0–99)
Triglycerides: 218 mg/dL — ABNORMAL HIGH (ref 0–149)
VLDL Cholesterol Cal: 41 mg/dL — ABNORMAL HIGH (ref 5–40)

## 2020-04-17 LAB — ARTHRITIS PANEL
Basophils Absolute: 0.1 10*3/uL (ref 0.0–0.2)
Basos: 1 %
EOS (ABSOLUTE): 0.2 10*3/uL (ref 0.0–0.4)
Eos: 2 %
Hematocrit: 38.9 % (ref 37.5–51.0)
Hemoglobin: 13.4 g/dL (ref 13.0–17.7)
Immature Grans (Abs): 0 10*3/uL (ref 0.0–0.1)
Immature Granulocytes: 0 %
Lymphocytes Absolute: 2.4 10*3/uL (ref 0.7–3.1)
Lymphs: 26 %
MCH: 31.9 pg (ref 26.6–33.0)
MCHC: 34.4 g/dL (ref 31.5–35.7)
MCV: 93 fL (ref 79–97)
Monocytes Absolute: 0.9 10*3/uL (ref 0.1–0.9)
Monocytes: 10 %
Neutrophils Absolute: 5.7 10*3/uL (ref 1.4–7.0)
Neutrophils: 61 %
Platelets: 214 10*3/uL (ref 150–450)
RBC: 4.2 x10E6/uL (ref 4.14–5.80)
RDW: 11.4 % — ABNORMAL LOW (ref 11.6–15.4)
Rheumatoid fact SerPl-aCnc: 26.5 IU/mL — ABNORMAL HIGH (ref 0.0–13.9)
Sed Rate: 29 mm/hr (ref 0–30)
Uric Acid: 7.4 mg/dL (ref 3.8–8.4)
WBC: 9.3 10*3/uL (ref 3.4–10.8)

## 2020-04-17 MED ORDER — ROSUVASTATIN CALCIUM 20 MG PO TABS
20.0000 mg | ORAL_TABLET | Freq: Every day | ORAL | 3 refills | Status: DC
Start: 2020-04-17 — End: 2021-04-14

## 2020-04-17 NOTE — Addendum Note (Signed)
Addended by: Bennie Pierini on: 04/17/2020 12:01 PM   Modules accepted: Orders

## 2020-04-24 ENCOUNTER — Ambulatory Visit: Payer: BC Managed Care – PPO | Admitting: Nurse Practitioner

## 2020-04-28 ENCOUNTER — Other Ambulatory Visit: Payer: Self-pay | Admitting: Nurse Practitioner

## 2020-04-28 DIAGNOSIS — M2550 Pain in unspecified joint: Secondary | ICD-10-CM

## 2020-05-21 ENCOUNTER — Telehealth: Payer: Self-pay | Admitting: Nurse Practitioner

## 2020-05-21 NOTE — Telephone Encounter (Signed)
Can not do p ain meds without being seen 

## 2020-05-22 MED ORDER — CELECOXIB 200 MG PO CAPS
200.0000 mg | ORAL_CAPSULE | Freq: Two times a day (BID) | ORAL | 2 refills | Status: DC
Start: 2020-05-22 — End: 2021-02-20

## 2020-05-22 NOTE — Telephone Encounter (Signed)
Pt and wife prefers something NON-Control, due to hx of alcohol  He is currently using tylenol, but not helping pain at all  [facility]   Can something be sent in?

## 2020-05-22 NOTE — Telephone Encounter (Signed)
Sent in celebrex rx BID

## 2020-05-29 ENCOUNTER — Telehealth: Payer: Self-pay | Admitting: Nurse Practitioner

## 2020-05-29 NOTE — Telephone Encounter (Signed)
Pt needs a rx for pain for arthritis everywhere. Pt has apt with rheumotologist on 06/24/2020. Pt cannot take narcotic because he is an alcohol. [facility]. Spouse asked for a call back

## 2020-05-29 NOTE — Telephone Encounter (Signed)
Advised pt's wife that [facility] had sent in Celebrex 05/22/20 and wife states the pharmacy never contacted them. Advised her to call to get the medication filled and she voiced understanding.

## 2020-06-24 DIAGNOSIS — M2550 Pain in unspecified joint: Secondary | ICD-10-CM | POA: Diagnosis not present

## 2020-06-24 DIAGNOSIS — R5382 Chronic fatigue, unspecified: Secondary | ICD-10-CM | POA: Diagnosis not present

## 2020-06-24 DIAGNOSIS — R768 Other specified abnormal immunological findings in serum: Secondary | ICD-10-CM | POA: Diagnosis not present

## 2020-07-14 DIAGNOSIS — R5382 Chronic fatigue, unspecified: Secondary | ICD-10-CM | POA: Diagnosis not present

## 2020-07-14 DIAGNOSIS — R768 Other specified abnormal immunological findings in serum: Secondary | ICD-10-CM | POA: Diagnosis not present

## 2020-07-14 DIAGNOSIS — M2550 Pain in unspecified joint: Secondary | ICD-10-CM | POA: Diagnosis not present

## 2020-09-04 ENCOUNTER — Encounter: Payer: Self-pay | Admitting: *Deleted

## 2020-10-20 ENCOUNTER — Ambulatory Visit: Payer: Self-pay | Admitting: Nurse Practitioner

## 2020-10-23 ENCOUNTER — Ambulatory Visit: Payer: Self-pay | Admitting: Nurse Practitioner

## 2021-01-13 ENCOUNTER — Encounter: Payer: Self-pay | Admitting: Nurse Practitioner

## 2021-01-13 ENCOUNTER — Ambulatory Visit (INDEPENDENT_AMBULATORY_CARE_PROVIDER_SITE_OTHER): Payer: BC Managed Care – PPO | Admitting: Nurse Practitioner

## 2021-01-13 ENCOUNTER — Other Ambulatory Visit: Payer: Self-pay

## 2021-01-13 VITALS — BP 152/88 | HR 78 | Temp 99.5°F | Resp 20 | Ht 74.0 in | Wt 254.0 lb

## 2021-01-13 DIAGNOSIS — Z0000 Encounter for general adult medical examination without abnormal findings: Secondary | ICD-10-CM

## 2021-01-13 DIAGNOSIS — E782 Mixed hyperlipidemia: Secondary | ICD-10-CM | POA: Diagnosis not present

## 2021-01-13 DIAGNOSIS — F339 Major depressive disorder, recurrent, unspecified: Secondary | ICD-10-CM | POA: Diagnosis not present

## 2021-01-13 DIAGNOSIS — Z0001 Encounter for general adult medical examination with abnormal findings: Secondary | ICD-10-CM | POA: Diagnosis not present

## 2021-01-13 DIAGNOSIS — I10 Essential (primary) hypertension: Secondary | ICD-10-CM | POA: Diagnosis not present

## 2021-01-13 DIAGNOSIS — F411 Generalized anxiety disorder: Secondary | ICD-10-CM

## 2021-01-13 MED ORDER — ESCITALOPRAM OXALATE 10 MG PO TABS: 10.0000 mg | ORAL_TABLET | Freq: Every day | ORAL | 5 refills | Status: DC

## 2021-01-13 NOTE — Progress Notes (Signed)
Subjective:    Patient ID: Anthony Chavez, male    DOB: [birth year], 60 y.o.   MRN: [id number]   Chief Complaint: annual physical   HPI:  1. Annual physical exam Needs done for work  2. Essential hypertension, benign No c/lo chest pain, sob or headache. He does check his blood pressure at home. It usually stays in 957-473 systolic. BP Readings from Last 3 Encounters:  04/16/20 130/83  05/03/19 137/88  04/03/19 127/85     3. Mixed hyperlipidemia Does not watch diet and does no dedicated exercise. He has not been taking his  crestor daily. Lab Results  Component Value Date   CHOL 254 (H) 04/16/2020   HDL 38 (L) 04/16/2020   LDLCALC 175 (H) 04/16/2020   TRIG 218 (H) 04/16/2020   CHOLHDL 6.7 (H) 04/16/2020   The 10-year ASCVD risk score Mikey Bussing DC Jr., et al., 2013) is: 13.6%  4. Depression, recurrent ([place]) He has refused antidepressants in the past. Depression screen [facility] 2/9 01/13/2021 04/16/2020 05/03/2019  Decreased Interest 0 0 2  Down, Depressed, Hopeless 0 0 2  PHQ - 2 Score 0 0 4  Altered sleeping 1 - 1  Tired, decreased energy 0 - 1  Change in appetite 0 - 1  Feeling bad or failure about yourself  0 - 0  Trouble concentrating 0 - 0  Moving slowly or fidgety/restless 0 - 0  Suicidal thoughts 0 - 0  PHQ-9 Score 1 - 7  Difficult doing work/chores Not difficult at all - Not difficult at all   5. Generalized anxiety disorder He says he is doing well.  GAD 7 : Generalized Anxiety Score 01/13/2021 05/03/2019  Nervous, Anxious, on Edge 0 1  Control/stop worrying 0 1  Worry too much - different things 0 1  Trouble relaxing 0 1  Restless 0 1  Easily annoyed or irritable 0 1  Afraid - awful might happen 0 0  Total GAD 7 Score 0 6  Anxiety Difficulty Not difficult at all -        Outpatient Encounter Medications as of 01/13/2021  Medication Sig  . allopurinol (ZYLOPRIM) 100 MG tablet TAKE 2 TABLETS (200 MG TOTAL) BY MOUTH DAILY. (NEEDS TO BE SEEN BEFORE NEXT  REFILL)  . celecoxib (CELEBREX) 200 MG capsule Take 1 capsule (200 mg total) by mouth 2 (two) times daily.  . folic acid (FOLVITE) 1 MG tablet Take 1 mg by mouth every morning.  . hydrALAZINE (APRESOLINE) 25 MG tablet Take 1 tablet (25 mg total) by mouth in the morning and at bedtime.  Marland Kitchen losartan (COZAAR) 25 MG tablet Take 1 tablet (25 mg total) by mouth at bedtime.  . magnesium oxide (MAG-OX) 400 (241.3 Mg) MG tablet Take 1 tablet by mouth 2 (two) times daily.  . Melatonin 10 MG CAPS Take 1 capsule by mouth at bedtime.  . rosuvastatin (CRESTOR) 20 MG tablet Take 1 tablet (20 mg total) by mouth daily.  Marland Kitchen thiamine 100 MG tablet Take 100 mg by mouth daily.   No facility-administered encounter medications on file as of 01/13/2021.    Past Surgical History:  Procedure Laterality Date  . HERNIA REPAIR    . ring finger surgery Left   . TONSILLECTOMY      History reviewed. No pertinent family history.  New complaints: Has had a long history with alcoholism. He went to rehab for 6 weeks last year and had been doing well. Had a bad weekend according to his wife  and she is really worried about him.   Social history: Lives with his wife  Controlled substance contract: n/a    Review of Systems  Constitutional: Negative for diaphoresis.  Eyes: Negative for pain.  Respiratory: Negative for shortness of breath.   Cardiovascular: Negative for chest pain, palpitations and leg swelling.  Gastrointestinal: Negative for abdominal pain.  Endocrine: Negative for polydipsia.  Skin: Negative for rash.  Neurological: Negative for dizziness, weakness and headaches.  Hematological: Does not bruise/bleed easily.  All other systems reviewed and are negative.      Objective:   Physical Exam Vitals and nursing note reviewed.  Constitutional:      Appearance: Normal appearance. He is well-developed.  HENT:     Head: Normocephalic.     Nose: Nose normal.  Eyes:     Pupils: Pupils are equal,  round, and reactive to light.  Neck:     Thyroid: No thyroid mass or thyromegaly.     Vascular: No carotid bruit or JVD.     Trachea: Phonation normal.  Cardiovascular:     Rate and Rhythm: Normal rate and regular rhythm.  Pulmonary:     Effort: Pulmonary effort is normal. No respiratory distress.     Breath sounds: Normal breath sounds.  Abdominal:     General: Bowel sounds are normal.     Palpations: Abdomen is soft.     Tenderness: There is no abdominal tenderness.  Musculoskeletal:        General: Normal range of motion.     Cervical back: Normal range of motion and neck supple.  Lymphadenopathy:     Cervical: No cervical adenopathy.  Skin:    General: Skin is warm and dry.  Neurological:     Mental Status: He is alert and oriented to person, place, and time.  Psychiatric:        Behavior: Behavior normal.        Thought Content: Thought content normal.        Judgment: Judgment normal.     BP (!) 152/88   Pulse 78   Temp 99.5 F (37.5 C) (Temporal)   Resp 20   Ht 6' 2"  (1.88 m)   Wt 254 lb (115.2 kg)   SpO2 95%   BMI 32.61 kg/m         Assessment & Plan:  [name] comes in today with chief complaint of Annual Exam   Diagnosis and orders addressed:  1. Annual physical exam   2. Essential hypertension, benign Low sodium diet - CBC with Differential/Platelet - CMP14+EGFR  3. Mixed hyperlipidemia Low fat diet - Lipid panel  4. Depression, recurrent ([place]) Stress management - escitalopram (LEXAPRO) 10 MG tablet; Take 1 tablet (10 mg total) by mouth daily.  Dispense: 30 tablet; Refill: 5  5. Generalized anxiety disorder   Labs pending Health Maintenance reviewed Diet and exercise encouraged  Follow up plan: 3 months   Mary-Margaret Hassell Done, FNP

## 2021-01-13 NOTE — Patient Instructions (Signed)
Textbook of family medicine (9th ed., pp. 1062-1073). [place], [place]: [employer].">  Stress, Adult Stress is a normal reaction to life events. Stress is what you feel when life demands more than you are used to, or more than you think you can handle. Some stress can be useful, such as studying for a test or meeting a deadline at work. Stress that occurs too often or for too long can cause problems. It can affect your emotional health and interfere with relationships and normal daily activities. Too much stress can weaken your body's defense system (immune system) and increase your risk for physical illness. If you already have a medical problem, stress can make it worse. What are the causes? All sorts of life events can cause stress. An event that causes stress for one person may not be stressful for another person. Major life events, whether positive or negative, commonly cause stress. Examples include:  Losing a job or starting a new job.  Losing a loved one.  Moving to a new town or home.  Getting married or divorced.  Having a baby.  Getting injured or sick. Less obvious life events can also cause stress, especially if they occur day after day or in combination with each other. Examples include:  Working long hours.  Driving in traffic.  Caring for children.  Being in debt.  Being in a difficult relationship. What are the signs or symptoms? Stress can cause emotional symptoms, including:  Anxiety. This is feeling worried, afraid, on edge, overwhelmed, or out of control.  Anger, including irritation or impatience.  Depression. This is feeling sad, down, helpless, or guilty.  Trouble focusing, remembering, or making decisions. Stress can cause physical symptoms, including:  Aches and pains. These may affect your head, neck, back, stomach, or other areas of your body.  Tight muscles or a clenched jaw.  Low energy.  Trouble sleeping. Stress can cause unhealthy  behaviors, including:  Eating to feel better (overeating) or skipping meals.  Working too much or putting off tasks.  Smoking, drinking alcohol, or using drugs to feel better. How is this diagnosed? Stress is diagnosed through an assessment by your health care provider. He or she may diagnose this condition based on:  Your symptoms and any stressful life events.  Your medical history.  Tests to rule out other causes of your symptoms. Depending on your condition, your health care provider may refer you to a specialist for further evaluation. How is this treated? Stress management techniques are the recommended treatment for stress. Medicine is not typically recommended for the treatment of stress. Techniques to reduce your reaction to stressful life events include:  Stress identification. Monitor yourself for symptoms of stress and identify what causes stress for you. These skills may help you to avoid or prepare for stressful events.  Time management. Set your priorities, keep a calendar of events, and learn to say no. Taking these actions can help you avoid making too many commitments. Techniques for coping with stress include:  Rethinking the problem. Try to think realistically about stressful events rather than ignoring them or overreacting. Try to find the positives in a stressful situation rather than focusing on the negatives.  Exercise. Physical exercise can release both physical and emotional tension. The key is to find a form of exercise that you enjoy and do it regularly.  Relaxation techniques. These relax the body and mind. The key is to find one or more that you enjoy and use the techniques regularly. Examples include: ?   Meditation, deep breathing, or progressive relaxation techniques. ? Yoga or tai chi. ? Biofeedback, mindfulness techniques, or journaling. ? Listening to music, being out in nature, or participating in other hobbies.  Practicing a healthy lifestyle.  Eat a balanced diet, drink plenty of water, limit or avoid caffeine, and get plenty of sleep.  Having a strong support network. Spend time with family, friends, or other people you enjoy being around. Express your feelings and talk things over with someone you trust. Counseling or talk therapy with a mental health professional may be helpful if you are having trouble managing stress on your own.   Follow these instructions at home: Lifestyle  Avoid drugs.  Do not use any products that contain nicotine or tobacco, such as cigarettes, e-cigarettes, and chewing tobacco. If you need help quitting, ask your health care provider.  Limit alcohol intake to no more than 1 drink a day for nonpregnant women and 2 drinks a day for men. One drink equals 12 oz of beer, 5 oz of wine, or 1 oz of hard liquor  Do not use alcohol or drugs to relax.  Eat a balanced diet that includes fresh fruits and vegetables, whole grains, lean meats, fish, eggs, and beans, and low-fat dairy. Avoid processed foods and foods high in added fat, sugar, and salt.  Exercise at least 30 minutes on 5 or more days each week.  Get 7-8 hours of sleep each night.   General instructions  Practice stress management techniques as discussed with your health care provider.  Drink enough fluid to keep your urine clear or pale yellow.  Take over-the-counter and prescription medicines only as told by your health care provider.  Keep all follow-up visits as told by your health care provider. This is important.   Contact a health care provider if:  Your symptoms get worse.  You have new symptoms.  You feel overwhelmed by your problems and can no longer manage them on your own. Get help right away if:  You have thoughts of hurting yourself or others. If you ever feel like you may hurt yourself or others, or have thoughts about taking your own life, get help right away. You can go to your nearest emergency department or  call:  Your local emergency services (911 in the [place]  A suicide crisis helpline, such as the National Suicide Prevention Lifeline at 1-800-273-8255. This is open 24 hours a day. Summary  Stress is a normal reaction to life events. It can cause problems if it happens too often or for too long.  Practicing stress management techniques is the best way to treat stress.  Counseling or talk therapy with a mental health professional may be helpful if you are having trouble managing stress on your own. This information is not intended to replace advice given to you by your health care provider. Make sure you discuss any questions you have with your health care provider. Document Revised: 07/04/2020 Document Reviewed: 07/04/2020 Elsevier Patient Education  2021 Elsevier Inc.  

## 2021-01-14 LAB — CMP14+EGFR
ALT: 19 IU/L (ref 0–44)
AST: 19 IU/L (ref 0–40)
Albumin/Globulin Ratio: 1.4 (ref 1.2–2.2)
Albumin: 4.4 g/dL (ref 3.8–4.9)
Alkaline Phosphatase: 70 IU/L (ref 44–121)
BUN/Creatinine Ratio: 13 (ref 9–20)
BUN: 16 mg/dL (ref 6–24)
Bilirubin Total: 1 mg/dL (ref 0.0–1.2)
CO2: 26 mmol/L (ref 20–29)
Calcium: 9.8 mg/dL (ref 8.7–10.2)
Chloride: 96 mmol/L (ref 96–106)
Creatinine, Ser: 1.23 mg/dL (ref 0.76–1.27)
Globulin, Total: 3.1 g/dL (ref 1.5–4.5)
Glucose: 114 mg/dL — ABNORMAL HIGH (ref 65–99)
Potassium: 4.6 mmol/L (ref 3.5–5.2)
Sodium: 141 mmol/L (ref 134–144)
Total Protein: 7.5 g/dL (ref 6.0–8.5)
eGFR: 68 mL/min/{1.73_m2} (ref >59)

## 2021-01-14 LAB — LIPID PANEL
Chol/HDL Ratio: 6.5 ratio — ABNORMAL HIGH (ref 0.0–5.0)
Cholesterol, Total: 337 mg/dL — ABNORMAL HIGH (ref 100–199)
HDL: 52 mg/dL (ref >39)
LDL Chol Calc (NIH): 217 mg/dL — ABNORMAL HIGH (ref 0–99)
Triglycerides: 331 mg/dL — ABNORMAL HIGH (ref 0–149)
VLDL Cholesterol Cal: 68 mg/dL — ABNORMAL HIGH (ref 5–40)

## 2021-01-14 LAB — CBC WITH DIFFERENTIAL/PLATELET
Basophils Absolute: 0 10*3/uL (ref 0.0–0.2)
Basos: 1 %
EOS (ABSOLUTE): 0.1 10*3/uL (ref 0.0–0.4)
Eos: 1 %
Hematocrit: 44.2 % (ref 37.5–51.0)
Hemoglobin: 14.7 g/dL (ref 13.0–17.7)
Immature Grans (Abs): 0 10*3/uL (ref 0.0–0.1)
Immature Granulocytes: 0 %
Lymphocytes Absolute: 2.3 10*3/uL (ref 0.7–3.1)
Lymphs: 28 %
MCH: 30 pg (ref 26.6–33.0)
MCHC: 33.3 g/dL (ref 31.5–35.7)
MCV: 90 fL (ref 79–97)
Monocytes Absolute: 0.7 10*3/uL (ref 0.1–0.9)
Monocytes: 9 %
Neutrophils Absolute: 5.1 10*3/uL (ref 1.4–7.0)
Neutrophils: 61 %
Platelets: 249 10*3/uL (ref 150–450)
RBC: 4.9 x10E6/uL (ref 4.14–5.80)
RDW: 12.8 % (ref 11.6–15.4)
WBC: 8.2 10*3/uL (ref 3.4–10.8)

## 2021-02-04 ENCOUNTER — Other Ambulatory Visit: Payer: Self-pay | Admitting: Nurse Practitioner

## 2021-02-04 DIAGNOSIS — F339 Major depressive disorder, recurrent, unspecified: Secondary | ICD-10-CM

## 2021-02-20 ENCOUNTER — Other Ambulatory Visit: Payer: Self-pay | Admitting: Nurse Practitioner

## 2021-03-03 DIAGNOSIS — R451 Restlessness and agitation: Secondary | ICD-10-CM | POA: Diagnosis not present

## 2021-03-03 DIAGNOSIS — F10129 Alcohol abuse with intoxication, unspecified: Secondary | ICD-10-CM | POA: Diagnosis not present

## 2021-03-03 DIAGNOSIS — R918 Other nonspecific abnormal finding of lung field: Secondary | ICD-10-CM | POA: Diagnosis not present

## 2021-03-03 DIAGNOSIS — R400 Somnolence: Secondary | ICD-10-CM | POA: Diagnosis not present

## 2021-03-04 DIAGNOSIS — R400 Somnolence: Secondary | ICD-10-CM | POA: Diagnosis not present

## 2021-03-05 DIAGNOSIS — R9431 Abnormal electrocardiogram [ECG] [EKG]: Secondary | ICD-10-CM | POA: Diagnosis not present

## 2021-04-14 ENCOUNTER — Encounter: Payer: Self-pay | Admitting: Nurse Practitioner

## 2021-04-14 ENCOUNTER — Other Ambulatory Visit: Payer: Self-pay

## 2021-04-14 ENCOUNTER — Ambulatory Visit (INDEPENDENT_AMBULATORY_CARE_PROVIDER_SITE_OTHER): Payer: BC Managed Care – PPO | Admitting: Nurse Practitioner

## 2021-04-14 VITALS — BP 140/85 | HR 71 | Temp 97.2°F | Resp 20 | Ht 74.0 in | Wt 256.0 lb

## 2021-04-14 DIAGNOSIS — F339 Major depressive disorder, recurrent, unspecified: Secondary | ICD-10-CM

## 2021-04-14 DIAGNOSIS — E782 Mixed hyperlipidemia: Secondary | ICD-10-CM

## 2021-04-14 DIAGNOSIS — M1A0790 Idiopathic chronic gout, unspecified ankle and foot, without tophus (tophi): Secondary | ICD-10-CM

## 2021-04-14 DIAGNOSIS — I10 Essential (primary) hypertension: Secondary | ICD-10-CM | POA: Diagnosis not present

## 2021-04-14 DIAGNOSIS — F411 Generalized anxiety disorder: Secondary | ICD-10-CM | POA: Diagnosis not present

## 2021-04-14 MED ORDER — ESCITALOPRAM OXALATE 20 MG PO TABS: 20.0000 mg | ORAL_TABLET | Freq: Every day | ORAL | 5 refills | Status: DC

## 2021-04-14 MED ORDER — ALLOPURINOL 100 MG PO TABS: 200.0000 mg | ORAL_TABLET | Freq: Every day | ORAL | Status: DC

## 2021-04-14 MED ORDER — LOSARTAN POTASSIUM 25 MG PO TABS
25.0000 mg | ORAL_TABLET | Freq: Every day | ORAL | 1 refills | Status: DC
Start: 2021-04-14 — End: 2021-10-15

## 2021-04-14 MED ORDER — HYDRALAZINE HCL 25 MG PO TABS: 25.0000 mg | ORAL_TABLET | Freq: Two times a day (BID) | ORAL | 1 refills | Status: DC

## 2021-04-14 MED ORDER — ROSUVASTATIN CALCIUM 20 MG PO TABS: 20.0000 mg | ORAL_TABLET | Freq: Every day | ORAL | 3 refills | Status: DC

## 2021-04-14 NOTE — Progress Notes (Signed)
Subjective:    Patient ID: Anthony Chavez, male    DOB: 02/26/1961, 60 y.o.   MRN: [id number]   Chief Complaint: Medical Management of Chronic Issues    HPI:  1. Essential hypertension, benign No c/o chest pain, sob or headcahe. Doe snot check blood presure at home. BP Readings from Last 3 Encounters:  04/14/21 140/85  01/13/21 (!) 152/88  04/16/20 130/83     2. Mixed hyperlipidemia Does try to watch diet but does very little exercise Lab Results  Component Value Date   CHOL 337 (H) 01/13/2021   HDL 52 01/13/2021   LDLCALC 217 (H) 01/13/2021   TRIG 331 (H) 01/13/2021   CHOLHDL 6.5 (H) 01/13/2021     3. Depression, recurrent ([place]) Is on  lexapro daily. Says he is doing pretty good.  4. Generalized anxiety disorder Is currently doing well. His AA meetings have really helped.  5. Chronic gout of foot, unspecified cause, unspecified laterality No recent flare ups.    Outpatient Encounter Medications as of 04/14/2021  Medication Sig   celecoxib (CELEBREX) 200 MG capsule TAKE 1 CAPSULE BY MOUTH TWICE A DAY   escitalopram (LEXAPRO) 10 MG tablet TAKE 1 TABLET BY MOUTH EVERY DAY   allopurinol (ZYLOPRIM) 100 MG tablet TAKE 2 TABLETS (200 MG TOTAL) BY MOUTH DAILY. (NEEDS TO BE SEEN BEFORE NEXT REFILL) (Patient not taking: No sig reported)   folic acid (FOLVITE) 1 MG tablet Take 1 mg by mouth every morning. (Patient not taking: Reported on 01/13/2021)   hydrALAZINE (APRESOLINE) 25 MG tablet Take 1 tablet (25 mg total) by mouth in the morning and at bedtime. (Patient not taking: Reported on 01/13/2021)   losartan (COZAAR) 25 MG tablet Take 1 tablet (25 mg total) by mouth at bedtime. (Patient not taking: Reported on 01/13/2021)   magnesium oxide (MAG-OX) 400 (241.3 Mg) MG tablet Take 1 tablet by mouth 2 (two) times daily. (Patient not taking: Reported on 01/13/2021)   Melatonin 10 MG CAPS Take 1 capsule by mouth at bedtime. (Patient not taking: Reported on 01/13/2021)    rosuvastatin (CRESTOR) 20 MG tablet Take 1 tablet (20 mg total) by mouth daily. (Patient not taking: Reported on 01/13/2021)   thiamine 100 MG tablet Take 100 mg by mouth daily. (Patient not taking: Reported on 01/13/2021)   No facility-administered encounter medications on file as of 04/14/2021.    Past Surgical History:  Procedure Laterality Date   HERNIA REPAIR     ring finger surgery Left    TONSILLECTOMY      History reviewed. No pertinent family history.  New complaints: Relapsed the first Monday of May. He started drinking on Monday and woke up in the hospital on Wednesday. Since then he has been going to meetings now which has really helped. He has also going to church.  Social history: Lives with his wife.   Controlled substance contract: n/a      Review of Systems  Constitutional:  Negative for diaphoresis.  Eyes:  Negative for pain.  Respiratory:  Negative for shortness of breath.   Cardiovascular:  Negative for chest pain, palpitations and leg swelling.  Gastrointestinal:  Negative for abdominal pain.  Endocrine: Negative for polydipsia.  Skin:  Negative for rash.  Neurological:  Negative for dizziness, weakness and headaches.  Hematological:  Does not bruise/bleed easily.  All other systems reviewed and are negative.     Objective:   Physical Exam Vitals and nursing note reviewed.  Constitutional:  Appearance: Normal appearance. He is well-developed.  HENT:     Head: Normocephalic.     Nose: Nose normal.  Eyes:     Pupils: Pupils are equal, round, and reactive to light.  Neck:     Thyroid: No thyroid mass or thyromegaly.     Vascular: No carotid bruit or JVD.     Trachea: Phonation normal.  Cardiovascular:     Rate and Rhythm: Normal rate and regular rhythm.  Pulmonary:     Effort: Pulmonary effort is normal. No respiratory distress.     Breath sounds: Normal breath sounds.  Abdominal:     General: Bowel sounds are normal.     Palpations:  Abdomen is soft.     Tenderness: There is no abdominal tenderness.  Musculoskeletal:        General: Normal range of motion.     Cervical back: Normal range of motion and neck supple.  Lymphadenopathy:     Cervical: No cervical adenopathy.  Skin:    General: Skin is warm and dry.  Neurological:     Mental Status: He is alert and oriented to person, place, and time.  Psychiatric:        Behavior: Behavior normal.        Thought Content: Thought content normal.        Judgment: Judgment normal.    BP 140/85   Pulse 71   Temp (!) 97.2 F (36.2 C) (Temporal)   Resp 20   Ht 6' 2"  (1.88 m)   Wt 256 lb (116.1 kg)   SpO2 95%   BMI 32.87 kg/m        Assessment & Plan:   Anthony Chavez comes in today with chief complaint of Medical Management of Chronic Issues   Diagnosis and orders addressed:  1. Essential hypertension, benign Low sodium diet - CBC with Differential/Platelet - CMP14+EGFR  2. Mixed hyperlipidemia Low fat diet - Lipid panel  3. Depression, recurrent ([place]) Continue stress management increased lexapro to 45m daily  4. Generalized anxiety disorder Use hydralazine as needed  5. Chronic gout of foot, unspecified cause, unspecified laterality Continue allopurinol daily   Labs pending Health Maintenance reviewed Diet and exercise encouraged  Follow up plan: 6 months   MMilan FNP

## 2021-04-14 NOTE — Patient Instructions (Signed)
Textbook of family medicine (9th ed., pp. 1062-1073). [place], [place]: [employer].">  Stress, Adult Stress is a normal reaction to life events. Stress is what you feel when life demands more than you are used to, or more than you think you can handle. Some stress can be useful, such as studying for a test or meeting a deadline at work. Stress that occurs too often or for too long can cause problems. It can affect your emotional health and interfere with relationships and normal daily activities. Too much stress can weaken your body's defense system (immune system) and increase your risk for physical illness. If you already have a medicalproblem, stress can make it worse. What are the causes? All sorts of life events can cause stress. An event that causes stress for one person may not be stressful for another person. Major life events, whether positive or negative, commonly cause stress. Examples include: Losing a job or starting a new job. Losing a loved one. Moving to a new town or home. Getting married or divorced. Having a baby. Getting injured or sick. Less obvious life events can also cause stress, especially if they occur day after day or in combination with each other. Examples include: Working long hours. Driving in traffic. Caring for children. Being in debt. Being in a difficult relationship. What are the signs or symptoms? Stress can cause emotional symptoms, including: Anxiety. This is feeling worried, afraid, on edge, overwhelmed, or out of control. Anger, including irritation or impatience. Depression. This is feeling sad, down, helpless, or guilty. Trouble focusing, remembering, or making decisions. Stress can cause physical symptoms, including: Aches and pains. These may affect your head, neck, back, stomach, or other areas of your body. Tight muscles or a clenched jaw. Low energy. Trouble sleeping. Stress can cause unhealthy behaviors, including: Eating to feel better  (overeating) or skipping meals. Working too much or putting off tasks. Smoking, drinking alcohol, or using drugs to feel better. How is this diagnosed? Stress is diagnosed through an assessment by your health care provider. He or she may diagnose this condition based on: Your symptoms and any stressful life events. Your medical history. Tests to rule out other causes of your symptoms. Depending on your condition, your health care provider may refer you to aspecialist for further evaluation. How is this treated?  Stress management techniques are the recommended treatment for stress. Medicineis not typically recommended for the treatment of stress. Techniques to reduce your reaction to stressful life events include: Stress identification. Monitor yourself for symptoms of stress and identify what causes stress for you. These skills may help you to avoid or prepare for stressful events. Time management. Set your priorities, keep a calendar of events, and learn to say no. Taking these actions can help you avoid making too many commitments. Techniques for coping with stress include: Rethinking the problem. Try to think realistically about stressful events rather than ignoring them or overreacting. Try to find the positives in a stressful situation rather than focusing on the negatives. Exercise. Physical exercise can release both physical and emotional tension. The key is to find a form of exercise that you enjoy and do it regularly. Relaxation techniques. These relax the body and mind. The key is to find one or more that you enjoy and use the techniques regularly. Examples include: Meditation, deep breathing, or progressive relaxation techniques. Yoga or tai chi. Biofeedback, mindfulness techniques, or journaling. Listening to music, being out in nature, or participating in other hobbies. Practicing a healthy lifestyle.   Eat a balanced diet, drink plenty of water, limit or avoid caffeine, and get  plenty of sleep. Having a strong support network. Spend time with family, friends, or other people you enjoy being around. Express your feelings and talk things over with someone you trust. Counseling or talk therapy with a mental health professional may be helpful if you are havingtrouble managing stress on your own. Follow these instructions at home: Lifestyle  Avoid drugs. Do not use any products that contain nicotine or tobacco, such as cigarettes, e-cigarettes, and chewing tobacco. If you need help quitting, ask your health care provider. Limit alcohol intake to no more than 1 drink a day for nonpregnant women and 2 drinks a day for men. One drink equals 12 oz of beer, 5 oz of wine, or 1 oz of hard liquor Do not use alcohol or drugs to relax. Eat a balanced diet that includes fresh fruits and vegetables, whole grains, lean meats, fish, eggs, and beans, and low-fat dairy. Avoid processed foods and foods high in added fat, sugar, and salt. Exercise at least 30 minutes on 5 or more days each week. Get 7-8 hours of sleep each night.  General instructions  Practice stress management techniques as discussed with your health care provider. Drink enough fluid to keep your urine clear or pale yellow. Take over-the-counter and prescription medicines only as told by your health care provider. Keep all follow-up visits as told by your health care provider. This is important.  Contact a health care provider if: Your symptoms get worse. You have new symptoms. You feel overwhelmed by your problems and can no longer manage them on your own. Get help right away if: You have thoughts of hurting yourself or others. If you ever feel like you may hurt yourself or others, or have thoughts about taking your own life, get help right away. You can go to your nearest emergency department or call: Your local emergency services (911 in the [place] A suicide crisis helpline, such as the [facility] at 4253100524. This is open 24 hours a day. Summary Stress is a normal reaction to life events. It can cause problems if it happens too often or for too long. Practicing stress management techniques is the best way to treat stress. Counseling or talk therapy with a mental health professional may be helpful if you are having trouble managing stress on your own. This information is not intended to replace advice given to you by your health care provider. Make sure you discuss any questions you have with your healthcare provider. Document Revised: 07/04/2020 Document Reviewed: 07/04/2020 Elsevier Patient Education  2022 [employer].

## 2021-04-15 LAB — LIPID PANEL
Chol/HDL Ratio: 6.9 ratio — ABNORMAL HIGH (ref 0.0–5.0)
Cholesterol, Total: 291 mg/dL — ABNORMAL HIGH (ref 100–199)
HDL: 42 mg/dL (ref >39)
LDL Chol Calc (NIH): 213 mg/dL — ABNORMAL HIGH (ref 0–99)
Triglycerides: 184 mg/dL — ABNORMAL HIGH (ref 0–149)
VLDL Cholesterol Cal: 36 mg/dL (ref 5–40)

## 2021-04-15 LAB — CBC WITH DIFFERENTIAL/PLATELET
Basophils Absolute: 0.1 10*3/uL (ref 0.0–0.2)
Basos: 1 %
EOS (ABSOLUTE): 0.2 10*3/uL (ref 0.0–0.4)
Eos: 3 %
Hematocrit: 42 % (ref 37.5–51.0)
Hemoglobin: 13.9 g/dL (ref 13.0–17.7)
Immature Grans (Abs): 0 10*3/uL (ref 0.0–0.1)
Immature Granulocytes: 0 %
Lymphocytes Absolute: 2.8 10*3/uL (ref 0.7–3.1)
Lymphs: 40 %
MCH: 30.2 pg (ref 26.6–33.0)
MCHC: 33.1 g/dL (ref 31.5–35.7)
MCV: 91 fL (ref 79–97)
Monocytes Absolute: 0.5 10*3/uL (ref 0.1–0.9)
Monocytes: 7 %
Neutrophils Absolute: 3.4 10*3/uL (ref 1.4–7.0)
Neutrophils: 49 %
Platelets: 216 10*3/uL (ref 150–450)
RBC: 4.61 x10E6/uL (ref 4.14–5.80)
RDW: 12.4 % (ref 11.6–15.4)
WBC: 7 10*3/uL (ref 3.4–10.8)

## 2021-04-15 LAB — CMP14+EGFR
ALT: 22 IU/L (ref 0–44)
AST: 21 IU/L (ref 0–40)
Albumin/Globulin Ratio: 2 (ref 1.2–2.2)
Albumin: 4.9 g/dL (ref 3.8–4.9)
Alkaline Phosphatase: 56 IU/L (ref 44–121)
BUN/Creatinine Ratio: 23 — ABNORMAL HIGH (ref 9–20)
BUN: 26 mg/dL — ABNORMAL HIGH (ref 6–24)
Bilirubin Total: 0.6 mg/dL (ref 0.0–1.2)
CO2: 23 mmol/L (ref 20–29)
Calcium: 9.7 mg/dL (ref 8.7–10.2)
Chloride: 102 mmol/L (ref 96–106)
Creatinine, Ser: 1.12 mg/dL (ref 0.76–1.27)
Globulin, Total: 2.4 g/dL (ref 1.5–4.5)
Glucose: 98 mg/dL (ref 65–99)
Potassium: 4.7 mmol/L (ref 3.5–5.2)
Sodium: 141 mmol/L (ref 134–144)
Total Protein: 7.3 g/dL (ref 6.0–8.5)
eGFR: 76 mL/min/{1.73_m2} (ref >59)

## 2021-05-06 ENCOUNTER — Other Ambulatory Visit: Payer: Self-pay | Admitting: Nurse Practitioner

## 2021-05-06 DIAGNOSIS — F339 Major depressive disorder, recurrent, unspecified: Secondary | ICD-10-CM

## 2021-06-25 ENCOUNTER — Other Ambulatory Visit: Payer: Self-pay | Admitting: Nurse Practitioner

## 2021-06-25 MED ORDER — PREDNISONE 20 MG PO TABS: 40.0000 mg | ORAL_TABLET | Freq: Every day | ORAL | 0 refills | Status: AC

## 2021-10-15 ENCOUNTER — Encounter: Payer: Self-pay | Admitting: Nurse Practitioner

## 2021-10-15 ENCOUNTER — Ambulatory Visit: Payer: Self-pay | Admitting: Nurse Practitioner

## 2021-10-15 ENCOUNTER — Ambulatory Visit (INDEPENDENT_AMBULATORY_CARE_PROVIDER_SITE_OTHER): Payer: BC Managed Care – PPO | Admitting: Nurse Practitioner

## 2021-10-15 VITALS — BP 136/84 | HR 70 | Temp 98.1°F | Resp 20 | Ht 74.0 in | Wt 261.0 lb

## 2021-10-15 DIAGNOSIS — F339 Major depressive disorder, recurrent, unspecified: Secondary | ICD-10-CM

## 2021-10-15 DIAGNOSIS — E782 Mixed hyperlipidemia: Secondary | ICD-10-CM

## 2021-10-15 DIAGNOSIS — F411 Generalized anxiety disorder: Secondary | ICD-10-CM | POA: Diagnosis not present

## 2021-10-15 DIAGNOSIS — I10 Essential (primary) hypertension: Secondary | ICD-10-CM | POA: Diagnosis not present

## 2021-10-15 DIAGNOSIS — N528 Other male erectile dysfunction: Secondary | ICD-10-CM

## 2021-10-15 DIAGNOSIS — Z125 Encounter for screening for malignant neoplasm of prostate: Secondary | ICD-10-CM

## 2021-10-15 MED ORDER — SILDENAFIL CITRATE 50 MG PO TABS
50.0000 mg | ORAL_TABLET | Freq: Every day | ORAL | 0 refills | Status: DC | PRN
Start: 2021-10-15 — End: 2021-10-15

## 2021-10-15 MED ORDER — SILDENAFIL CITRATE 50 MG PO TABS: 50.0000 mg | ORAL_TABLET | Freq: Every day | ORAL | 6 refills | Status: DC | PRN

## 2021-10-15 NOTE — Progress Notes (Signed)
Subjective:    Patient ID: Anthony Chavez, male    DOB: 1961/03/09, 60 y.o.   MRN: [id number]   Chief Complaint: Medical Management of Chronic Issues    HPI:  1. Essential hypertension, benign No c/o chest pain, sob or headache. He does not check his blood pressure at home. Says he feels like he doe snot need it. BP Readings from Last 3 Encounters:  10/15/21 136/84  04/14/21 140/85  01/13/21 (!) 152/88     2. Mixed hyperlipidemia He has been trying to watch diet and does occasional exercise and stays very active. He was prescribed crestor but refuses to take meds.  Lab Results  Component Value Date   CHOL 291 (H) 04/14/2021   HDL 42 04/14/2021   LDLCALC 213 (H) 04/14/2021   TRIG 184 (H) 04/14/2021   CHOLHDL 6.9 (H) 04/14/2021  The 10-year ASCVD risk score (Arnett DK, et al., 2019) is: 16.4%   Values used to calculate the score:     Age: 50 years     Sex: Male     Is Non-Hispanic African American: No     Diabetic: No     Tobacco smoker: No     Systolic Blood Pressure: 136 mmHg     Is BP treated: Yes     HDL Cholesterol: 42 mg/dL     Total Cholesterol: 291 mg/dL    3. Depression, recurrent (HCC) He s on lexapro and is doing well. left Depression screen [facility] 2/9 10/15/2021 04/14/2021 01/13/2021  Decreased Interest 0 1 0  Down, Depressed, Hopeless 0 1 0  PHQ - 2 Score 0 2 0  Altered sleeping 0 1 1  Tired, decreased energy 0 1 0  Change in appetite 0 1 0  Feeling bad or failure about yourself  0 1 0  Trouble concentrating 0 1 0  Moving slowly or fidgety/restless 0 1 0  Suicidal thoughts 0 1 0  PHQ-9 Score 0 9 1  Difficult doing work/chores Not difficult at all Not difficult at all Not difficult at all  Some recent data might be hidden     4. Generalized anxiety disorder Lexapro works well. GAD 7 : Generalized Anxiety Score 10/15/2021 04/14/2021 01/13/2021 05/03/2019  Nervous, Anxious, on Edge 0 0 0 1  Control/stop worrying 0 0 0 1  Worry too much - different  things 0 0 0 1  Trouble relaxing 0 0 0 1  Restless 0 0 0 1  Easily annoyed or irritable 0 0 0 1  Afraid - awful might happen 0 0 0 0  Total GAD 7 Score 0 0 0 6  Anxiety Difficulty Not difficult at all Not difficult at all Not difficult at all -        Outpatient Encounter Medications as of 10/15/2021  Medication Sig   celecoxib (CELEBREX) 200 MG capsule TAKE 1 CAPSULE BY MOUTH TWICE A DAY   escitalopram (LEXAPRO) 20 MG tablet TAKE 1 TABLET BY MOUTH EVERY DAY   Melatonin 10 MG CAPS Take 1 capsule by mouth at bedtime.   allopurinol (ZYLOPRIM) 100 MG tablet Take 2 tablets (200 mg total) by mouth daily. (Needs to be seen before next refill) (Patient not taking: Reported on 10/15/2021)   folic acid (FOLVITE) 1 MG tablet Take 1 mg by mouth every morning. (Patient not taking: Reported on 01/13/2021)   hydrALAZINE (APRESOLINE) 25 MG tablet Take 1 tablet (25 mg total) by mouth in the morning and at bedtime. (Patient not taking: Reported  on 10/15/2021)   losartan (COZAAR) 25 MG tablet Take 1 tablet (25 mg total) by mouth at bedtime. (Patient not taking: Reported on 10/15/2021)   magnesium oxide (MAG-OX) 400 (241.3 Mg) MG tablet Take 1 tablet by mouth 2 (two) times daily. (Patient not taking: Reported on 01/13/2021)   rosuvastatin (CRESTOR) 20 MG tablet Take 1 tablet (20 mg total) by mouth daily. (Patient not taking: Reported on 10/15/2021)   thiamine 100 MG tablet Take 100 mg by mouth daily. (Patient not taking: Reported on 01/13/2021)   No facility-administered encounter medications on file as of 10/15/2021.    Past Surgical History:  Procedure Laterality Date   HERNIA REPAIR     ring finger surgery Left    TONSILLECTOMY      History reviewed. No pertinent family history.  New complaints: -No complaints. 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  Eyes:  Negative for pain.  Respiratory:  Negative for shortness of breath.   Cardiovascular:  Negative for chest pain, palpitations and leg swelling.  Gastrointestinal:  Negative for abdominal pain.  Endocrine: Negative for polydipsia.  Skin:  Negative for rash.  Neurological:  Negative for dizziness, weakness and headaches.  Hematological:  Does not bruise/bleed easily.  All other systems reviewed and are negative.     Objective:   Physical Exam Vitals and nursing note reviewed.  Constitutional:      Appearance: Normal appearance. He is well-developed.  HENT:     Head: Normocephalic.     Nose: Nose normal.     Mouth/Throat:     Mouth: Mucous membranes are moist.     Pharynx: Oropharynx is clear.  Eyes:     Pupils: Pupils are equal, round, and reactive to light.  Neck:     Thyroid: No thyroid mass or thyromegaly.     Vascular: No carotid bruit or JVD.     Trachea: Phonation normal.  Cardiovascular:     Rate and Rhythm: Normal rate and regular rhythm.  Pulmonary:     Effort: Pulmonary effort is normal. No respiratory distress.     Breath sounds: Normal breath sounds.  Abdominal:     General: Bowel sounds are normal.     Palpations: Abdomen is soft.     Tenderness: There is no abdominal tenderness.  Musculoskeletal:        General: Normal range of motion.     Cervical back: Normal range of motion and neck supple.  Lymphadenopathy:     Cervical: No cervical adenopathy.  Skin:    General: Skin is warm and dry.  Neurological:     Mental Status: He is alert and oriented to person, place, and time.  Psychiatric:        Behavior: Behavior normal.        Thought Content: Thought content normal.        Judgment: Judgment normal.    BP 136/84    Pulse 70    Temp 98.1 F (36.7 C) (Temporal)    Resp 20    Ht 6\' 2"  (1.88 m)    Wt 261 lb (118.4 kg)    SpO2 98%    BMI 33.51 kg/m        Assessment & Plan:   Anthony Chavez  comes in today with chief complaint  of Medical Management of Chronic Issues   Diagnosis and orders addressed:  1. Essential hypertension, benign Low sodium diet  2. Mixed hyperlipidemia Low fat diet  3. Depression, recurrent [facility]) Stress management Patient wants to wean off of lexapro- discussed how  4. Generalized anxiety disorder Stress manaegment  5. Other male erectile dysfunction - sildenafil (VIAGRA) 50 MG tablet; Take 1 tablet (50 mg total) by mouth daily as needed for erectile dysfunction.  Dispense: 10 tablet; Refill: 0   Labs pending Health Maintenance reviewed Diet and exercise encouraged  Follow up plan: 6 month   Mary-Margaret Daphine Deutscher, FNP

## 2021-10-15 NOTE — Patient Instructions (Signed)
Erectile Dysfunction °Erectile dysfunction (ED) is the inability to get or keep an erection in order to have sexual intercourse. ED is considered a symptom of an underlying disorder and is not considered a disease. ED may include: °Inability to get an erection. °Lack of enough hardness of the erection to allow penetration. °Loss of erection before sex is finished. °What are the causes? °This condition may be caused by: °Physical causes, such as: °Artery problems. This may include heart disease, high blood pressure, atherosclerosis, and diabetes. °Hormonal problems, such as low testosterone. °Obesity. °Nerve problems. This may include back or pelvic injuries, multiple sclerosis, Parkinson's disease, spinal cord injury, and stroke. °Certain medicines, such as: °Pain relievers. °Antidepressants. °Blood pressure medicines and water pills (diuretics). °Cancer medicines. °Antihistamines. °Muscle relaxants. °Lifestyle factors, such as: °Use of drugs such as marijuana, cocaine, or opioids. °Excessive use of alcohol. °Smoking. °Lack of physical activity or exercise. °Psychological causes, such as: °Anxiety or stress. °Sadness or depression. °Exhaustion. °Fear about sexual performance. °Guilt. °What are the signs or symptoms? °Symptoms of this condition include: °Inability to get an erection. °Lack of enough hardness of the erection to allow penetration. °Loss of the erection before sex is finished. °Sometimes having normal erections, but with frequent unsatisfactory episodes. °Low sexual satisfaction in either partner due to erection problems. °A curved penis occurring with erection. The curve may cause pain, or the penis may be too curved to allow for intercourse. °Never having nighttime or morning erections. °How is this diagnosed? °This condition is often diagnosed by: °Performing a physical exam to find other diseases or specific problems with the penis. °Asking you detailed questions about the problem. °Doing tests,  such as: °Blood tests to check for diabetes mellitus or high cholesterol, or to measure hormone levels. °Other tests to check for underlying health conditions. °An ultrasound exam to check for scarring. °A test to check blood flow to the penis. °Doing a sleep study at home to measure nighttime erections. °How is this treated? °This condition may be treated by: °Medicines, such as: °Medicine taken by mouth to help you achieve an erection (oral medicine). °Hormone replacement therapy to replace low testosterone levels. °Medicine that is injected into the penis. Your health care provider may instruct you how to give yourself these injections at home. °Medicine that is delivered with a short applicator tube. The tube is inserted into the opening at the tip of the penis, which is the opening of the urethra. A tiny pellet of medicine is put in the urethra. The pellet dissolves and enhances erectile function. This is also called MUSE (medicated urethral system for erections) therapy. °Vacuum pump. This is a pump with a ring on it. The pump and ring are placed on the penis and used to create pressure that helps the penis become erect. °Penile implant surgery. In this procedure, you may receive: °An inflatable implant. This consists of cylinders, a pump, and a reservoir. The cylinders can be inflated with a fluid that helps to create an erection, and they can be deflated after intercourse. °A semi-rigid implant. This consists of two silicone rubber rods. The rods provide some rigidity. They are also flexible, so the penis can both curve downward in its normal position and become straight for sexual intercourse. °Blood vessel surgery to improve blood flow to the penis. During this procedure, a blood vessel from a different part of the body is placed into the penis to allow blood to flow around (bypass) damaged or blocked blood vessels. °Lifestyle changes,   such as exercising more, losing weight, and quitting smoking. °Follow  these instructions at home: °Medicines ° °Take over-the-counter and prescription medicines only as told by your health care provider. Do not increase the dosage without first discussing it with your health care provider. °If you are using self-injections, do injections as directed by your health care provider. Make sure you avoid any veins that are on the surface of the penis. After giving an injection, apply pressure to the injection site for 5 minutes. °Talk to your health care provider about how to prevent headaches while taking ED medicines. These medicines may cause a sudden headache due to the increase in blood flow in your body. °General instructions °Exercise regularly, as directed by your health care provider. Work with your health care provider to lose weight, if needed. °Do not use any products that contain nicotine or tobacco. These products include cigarettes, chewing tobacco, and vaping devices, such as e-cigarettes. If you need help quitting, ask your health care provider. °Before using a vacuum pump, read the instructions that come with the pump and discuss any questions with your health care provider. °Keep all follow-up visits. This is important. °Contact a health care provider if: °You feel nauseous. °You are vomiting. °You get sudden headaches while taking ED medicines. °You have any concerns about your sexual health. °Get help right away if: °You are taking oral or injectable medicines and you have an erection that lasts longer than 4 hours. If your health care provider is unavailable, go to the nearest emergency room for evaluation. An erection that lasts much longer than 4 hours can result in permanent damage to your penis. °You have severe pain in your groin or abdomen. °You develop redness or severe swelling of your penis. °You have redness spreading at your groin or lower abdomen. °You are unable to urinate. °You experience chest pain or a rapid heartbeat (palpitations) after taking oral  medicines. °These symptoms may represent a serious problem that is an emergency. Do not wait to see if the symptoms will go away. Get medical help right away. Call your local emergency services (911 in the [place] Do not drive yourself to the hospital. °Summary °Erectile dysfunction (ED) is the inability to get or keep an erection during sexual intercourse. °This condition is diagnosed based on a physical exam, your symptoms, and tests to determine the cause. Treatment varies depending on the cause and may include medicines, hormone therapy, surgery, or a vacuum pump. °You may need follow-up visits to make sure that you are using your medicines or devices correctly. °Get help right away if you are taking or injecting medicines and you have an erection that lasts longer than 4 hours. °This information is not intended to replace advice given to you by your health care provider. Make sure you discuss any questions you have with your health care provider. °Document Revised: 01/14/2021 Document Reviewed: 01/14/2021 °Elsevier Patient Education © 2022 [employer] ° °

## 2021-11-02 ENCOUNTER — Other Ambulatory Visit: Payer: Self-pay | Admitting: Nurse Practitioner

## 2021-11-02 DIAGNOSIS — F339 Major depressive disorder, recurrent, unspecified: Secondary | ICD-10-CM

## 2022-03-20 ENCOUNTER — Other Ambulatory Visit: Payer: Self-pay | Admitting: Nurse Practitioner

## 2022-04-15 ENCOUNTER — Encounter: Payer: Self-pay | Admitting: Nurse Practitioner

## 2022-04-15 ENCOUNTER — Ambulatory Visit (INDEPENDENT_AMBULATORY_CARE_PROVIDER_SITE_OTHER): Payer: BC Managed Care – PPO | Admitting: Nurse Practitioner

## 2022-04-15 VITALS — BP 128/80 | HR 67 | Temp 97.3°F | Resp 20 | Ht 74.0 in | Wt 255.0 lb

## 2022-04-15 DIAGNOSIS — F411 Generalized anxiety disorder: Secondary | ICD-10-CM

## 2022-04-15 DIAGNOSIS — E782 Mixed hyperlipidemia: Secondary | ICD-10-CM | POA: Diagnosis not present

## 2022-04-15 DIAGNOSIS — I10 Essential (primary) hypertension: Secondary | ICD-10-CM

## 2022-04-15 DIAGNOSIS — M1A0790 Idiopathic chronic gout, unspecified ankle and foot, without tophus (tophi): Secondary | ICD-10-CM

## 2022-04-15 DIAGNOSIS — F339 Major depressive disorder, recurrent, unspecified: Secondary | ICD-10-CM | POA: Diagnosis not present

## 2022-04-15 MED ORDER — ALLOPURINOL 100 MG PO TABS: 200.0000 mg | ORAL_TABLET | Freq: Every day | ORAL | Status: DC

## 2022-04-15 MED ORDER — ESCITALOPRAM OXALATE 20 MG PO TABS: 20.0000 mg | ORAL_TABLET | Freq: Every day | ORAL | 1 refills | Status: DC

## 2022-04-15 NOTE — Progress Notes (Signed)
Subjective:    Patient ID: Anthony Chavez, male    DOB: [birth year], 61 y.o.   MRN: [id number]   Chief Complaint: medical management of chronic issues     HPI:  Anthony Chavez is a 61 y.o. who identifies as a male who was assigned male at birth.   Social history: Lives with: wife Work history: works evening shift   Comes in today for follow up of the following chronic medical issues:  1. Essential hypertension, benign No c/o chest pain, sob or headache. Does not check blood pressure at home. BP Readings from Last 3 Encounters:  10/15/21 136/84  04/14/21 140/85  01/13/21 (!) 152/88     2. Mixed hyperlipidemia Does watch diet and stays very active. Lab Results  Component Value Date   CHOL 291 (H) 04/14/2021   HDL 42 04/14/2021   LDLCALC 213 (H) 04/14/2021   TRIG 184 (H) 04/14/2021   CHOLHDL 6.9 (H) 04/14/2021   The 10-year ASCVD risk score (Arnett DK, et al., 2019) is: 16.4%   3. Generalized anxiety disorder Is on lexapro and is doing well.    04/15/2022   10:12 AM 10/15/2021   10:50 AM 04/14/2021   12:41 PM 01/13/2021    3:07 PM  GAD 7 : Generalized Anxiety Score  Nervous, Anxious, on Edge 0 0 0 0  Control/stop worrying 0 0 0 0  Worry too much - different things 0 0 0 0  Trouble relaxing 0 0 0 0  Restless 0 0 0 0  Easily annoyed or irritable 0 0 0 0  Afraid - awful might happen 0 0 0 0  Total GAD 7 Score 0 0 0 0  Anxiety Difficulty Not difficult at all Not difficult at all Not difficult at all Not difficult at all      4. Depression, recurrent ([place]) Again is on lexapro and is doing well    04/15/2022   10:11 AM 10/15/2021   10:50 AM 04/14/2021   12:40 PM  Depression screen PHQ 2/9  Decreased Interest 0 0 1  Down, Depressed, Hopeless 0 0 1  PHQ - 2 Score 0 0 2  Altered sleeping 0 0 1  Tired, decreased energy 0 0 1  Change in appetite 0 0 1  Feeling bad or failure about yourself  0 0 1  Trouble concentrating 0 0 1  Moving slowly or  fidgety/restless 0 0 1  Suicidal thoughts 0 0 1  PHQ-9 Score 0 0 9  Difficult doing work/chores Not difficult at all Not difficult at all Not difficult at all     5. Chronic gout of foot, unspecified cause, unspecified laterality Denies any recent flare ups. 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 Is very involved in [place].   No Known Allergies Outpatient Encounter Medications as of 04/15/2022  Medication Sig   allopurinol (ZYLOPRIM) 100 MG tablet Take 2 tablets (200 mg total) by mouth daily. (Needs to be seen before next refill) (Patient not taking: Reported on 10/15/2021)   celecoxib (CELEBREX) 200 MG capsule TAKE 1 CAPSULE BY MOUTH TWICE A DAY   escitalopram (LEXAPRO) 20 MG tablet TAKE 1 TABLET BY MOUTH EVERY DAY   folic acid (FOLVITE) 1 MG tablet Take 1 mg by mouth every morning. (Patient not taking: Reported on 01/13/2021)   magnesium oxide (MAG-OX) 400 (241.3 Mg) MG tablet Take 1 tablet by mouth 2 (two) times daily. (Patient  not taking: Reported on 01/13/2021)   Melatonin 10 MG CAPS Take 1 capsule by mouth at bedtime.   sildenafil (VIAGRA) 50 MG tablet Take 1 tablet (50 mg total) by mouth daily as needed for erectile dysfunction.   thiamine 100 MG tablet Take 100 mg by mouth daily. (Patient not taking: Reported on 01/13/2021)   No facility-administered encounter medications on file as of 04/15/2022.    Past Surgical History:  Procedure Laterality Date   HERNIA REPAIR     ring finger surgery Left    TONSILLECTOMY      No family history on file.    Controlled substance contract: n/a     Review of Systems  Constitutional:  Negative for diaphoresis.  Eyes:  Negative for pain.  Respiratory:  Negative for shortness of breath.   Cardiovascular:  Negative for chest pain, palpitations and leg swelling.  Gastrointestinal:  Negative for abdominal pain.  Endocrine: Negative for polydipsia.  Skin:  Negative for rash.   Neurological:  Negative for dizziness, weakness and headaches.  Hematological:  Does not bruise/bleed easily.  All other systems reviewed and are negative.      Objective:   Physical Exam Vitals and nursing note reviewed.  Constitutional:      Appearance: Normal appearance. He is well-developed.  HENT:     Head: Normocephalic.     Nose: Nose normal.     Mouth/Throat:     Mouth: Mucous membranes are moist.     Pharynx: Oropharynx is clear.  Eyes:     Pupils: Pupils are equal, round, and reactive to light.  Neck:     Thyroid: No thyroid mass or thyromegaly.     Vascular: No carotid bruit or JVD.     Trachea: Phonation normal.  Cardiovascular:     Rate and Rhythm: Normal rate and regular rhythm.  Pulmonary:     Effort: Pulmonary effort is normal. No respiratory distress.     Breath sounds: Normal breath sounds.  Abdominal:     General: Bowel sounds are normal.     Palpations: Abdomen is soft.     Tenderness: There is no abdominal tenderness.  Musculoskeletal:        General: Normal range of motion.     Cervical back: Normal range of motion and neck supple.  Lymphadenopathy:     Cervical: No cervical adenopathy.  Skin:    General: Skin is warm and dry.  Neurological:     Mental Status: He is alert and oriented to person, place, and time.  Psychiatric:        Behavior: Behavior normal.        Thought Content: Thought content normal.        Judgment: Judgment normal.     BP 128/80   Pulse 67   Temp (!) 97.3 F (36.3 C) (Temporal)   Resp 20   Ht 6' 2"  (1.88 m)   Wt 255 lb (115.7 kg)   SpO2 96%   BMI 32.74 kg/m        Assessment & Plan:  EDU ON comes in today with chief complaint of Medical Management of Chronic Issues   Diagnosis and orders addressed:  1. Essential hypertension, benign Low sodium diet - CBC with Differential/Platelet; Future - CMP14+EGFR; Future  2. Mixed hyperlipidemia Low fat diet - Lipid panel; Future  3.  Generalized anxiety disorder Stress management  4. Depression, recurrent ([place]) - escitalopram (LEXAPRO) 20 MG tablet; Take 1 tablet (20 mg total) by mouth  daily.  Dispense: 90 tablet; Refill: 1 - Testosterone,Free and Total; Future  5. Chronic gout of foot, unspecified cause, unspecified laterality  - allopurinol (ZYLOPRIM) 100 MG tablet; Take 2 tablets (200 mg total) by mouth daily. (Needs to be seen before next refill)  Dispense: 60 tablet; Refill: 00   Labs pending Health Maintenance reviewed Diet and exercise encouraged  Follow up plan: 6 months   Mary-Margaret Hassell Done, FNP

## 2022-04-15 NOTE — Patient Instructions (Signed)
Testosterone Test Why am I having this test? Testosterone is a hormone made by the adrenal glands in the abdomen in both males and females. In males, it is also made by the testicles. Starting at puberty, testosterone stimulates the development of secondary sex characteristics in males. This includes a deeper voice, muscle and body hair growth, and penis enlargement. In females, testosterone is also produced in the ovaries. The male body converts testosterone into estradiol, the main male sex hormone. An abnormal level of testosterone can cause health problems in both males and females. You may have this test if your health care provider suspects that an abnormal testosterone level is causing or contributing to other health problems. In males, an abnormally low testosterone level can cause: Inability to have children. Trouble getting or maintaining an erection. Delayed puberty. In females, an abnormally high testosterone level can cause: Infertility. Polycystic ovary syndrome. Development of masculine features. What is being tested? This test measures the amount of total testosterone in your blood. What kind of sample is taken?  A blood sample is required for this test. It is usually collected by inserting a needle into a blood vessel. The sample is most often collected in the morning because that is when testosterone is usually the highest. How do I prepare for this test? Follow instructions from your health care provider about changing or stopping your regular medicines. Tell a health care provider about: Any allergies you have. All medicines you are taking, including vitamins, herbs, eye drops, creams, and over-the-counter medicines. Any blood disorders you have. Any surgeries you have had. Any medical conditions you have. Whether you are pregnant or may be pregnant. How are the results reported? Your test results will be reported as a value that indicates how much testosterone is  in your blood. This will be given as nanograms of testosterone per deciliter of blood (ng/dL). Your health care provider will compare your test results to normal ranges that were established after testing a large group of people (reference ranges). Reference ranges may vary among labs and hospitals. For this test, common reference ranges for total testosterone are: Male: 7 months to 60 years old: less than 30 ng/dL. 14-62 years old: less than 300 ng/dL. 33-12 years old: 170-540 ng/dL. 49-40 years old: 250-910 ng/dL. 20 years and older: 280-1,080 ng/dL. Male: 7 months to 60 years old: less than 30 ng/dL. 28-28 years old: less than 40 ng/dL. 47-110 years old: less than 60 ng/dL. 64-54 years old: less than 70 ng/dL. 20 years and older: less than 70 ng/dL. What do the results mean? A result that is within your reference range means that you have a normal amount of testosterone in your blood. In males: A high testosterone level may mean that you: Have certain types of tumors. Have an overactive thyroid gland (hyperthyroidism). Currently use anabolic steroids or used anabolic steroids in the past. Have an inherited disorder that affects the adrenal glands (congenital adrenal hyperplasia). Are starting puberty early (precocious puberty). A low testosterone level may mean that you: Have certain genetic diseases. Have had certain viral infections, such as mumps. Have a condition that affects the pituitary gland. Have injured your testicles. In females: A high testosterone level may mean that you have: Certain types of tumors, such as ovarian or adrenal gland tumors. An inherited disorder that affects certain cells in the adrenal glands (congenital adrenal hyperplasia). Polycystic ovary syndrome. A low testosterone level usually will not cause health problems. Talk with your health care provider  about what your results mean. Questions to ask your health care provider Ask your health care  provider, or the department that is doing the test: When will my results be ready? How will I get my results? What are my treatment options? What other tests do I need? What are my next steps? Summary Testosterone is a hormone made by the adrenal glands in the abdomen in both males and females. In males, it is also made by the testicles. Starting at puberty, testosterone stimulates the development of secondary sex characteristics in males. In females, testosterone is also produced in the ovaries. The male body converts testosterone into estradiol, the main male sex hormone. An abnormal level of testosterone can cause health issues in both males and females. You may have this test if your health care provider suspects that an abnormal testosterone level is causing or contributing to other health problems. This information is not intended to replace advice given to you by your health care provider. Make sure you discuss any questions you have with your health care provider. Document Revised: 07/21/2020 Document Reviewed: 07/21/2020 Elsevier Patient Education  2023 [employer].

## 2022-04-16 ENCOUNTER — Other Ambulatory Visit: Payer: Self-pay

## 2022-04-16 ENCOUNTER — Other Ambulatory Visit: Payer: BC Managed Care – PPO

## 2022-04-16 DIAGNOSIS — E782 Mixed hyperlipidemia: Secondary | ICD-10-CM

## 2022-04-16 DIAGNOSIS — I10 Essential (primary) hypertension: Secondary | ICD-10-CM

## 2022-04-16 DIAGNOSIS — F339 Major depressive disorder, recurrent, unspecified: Secondary | ICD-10-CM | POA: Diagnosis not present

## 2022-04-20 ENCOUNTER — Telehealth: Payer: Self-pay

## 2022-04-20 MED ORDER — TESTOSTERONE CYPIONATE 200 MG/ML IM SOLN: 200.0000 mg | INTRAMUSCULAR | 0 refills | Status: DC

## 2022-04-20 NOTE — Telephone Encounter (Signed)
Markee Molden Key: [id number] - Rx #: [id number]  Need help? Call us at [phone] Status  Sent to [facility] Testosterone Cypionate 200MG /ML intramuscular solution Form Blue Form (CB) Original Claim Info 75 PA REQUIRED--QUANTITY LIMIT MAY APPLY

## 2022-04-20 NOTE — Telephone Encounter (Signed)
Sent in testosterone injection  Meds ordered this encounter  Medications   testosterone cypionate (DEPOTESTOSTERONE CYPIONATE) 200 MG/ML injection    Sig: Inject 1 mL (200 mg total) into the muscle every 14 (fourteen) days.    Dispense:  10 mL    Refill:  0    Order Specific Question:   Supervising Provider    Answer:   Nils Pyle [[id number]]   Mary-Margaret Daphine Deutscher, FNP

## 2022-04-21 LAB — CMP14+EGFR
ALT: 20 IU/L (ref 0–44)
AST: 23 IU/L (ref 0–40)
Albumin/Globulin Ratio: 1.7 (ref 1.2–2.2)
Albumin: 4.4 g/dL (ref 3.8–4.9)
Alkaline Phosphatase: 50 IU/L (ref 44–121)
BUN/Creatinine Ratio: 20 (ref 9–20)
BUN: 27 mg/dL — ABNORMAL HIGH (ref 6–24)
Bilirubin Total: 0.5 mg/dL (ref 0.0–1.2)
CO2: 25 mmol/L (ref 20–29)
Calcium: 9.5 mg/dL (ref 8.7–10.2)
Chloride: 104 mmol/L (ref 96–106)
Creatinine, Ser: 1.36 mg/dL — ABNORMAL HIGH (ref 0.76–1.27)
Globulin, Total: 2.6 g/dL (ref 1.5–4.5)
Glucose: 101 mg/dL — ABNORMAL HIGH (ref 70–99)
Potassium: 4.7 mmol/L (ref 3.5–5.2)
Sodium: 145 mmol/L — ABNORMAL HIGH (ref 134–144)
Total Protein: 7 g/dL (ref 6.0–8.5)
eGFR: 60 mL/min/{1.73_m2} (ref >59)

## 2022-04-21 LAB — CBC WITH DIFFERENTIAL/PLATELET
Basophils Absolute: 0.1 10*3/uL (ref 0.0–0.2)
Basos: 1 %
EOS (ABSOLUTE): 0.2 10*3/uL (ref 0.0–0.4)
Eos: 3 %
Hematocrit: 40.7 % (ref 37.5–51.0)
Hemoglobin: 13.4 g/dL (ref 13.0–17.7)
Immature Grans (Abs): 0 10*3/uL (ref 0.0–0.1)
Immature Granulocytes: 0 %
Lymphocytes Absolute: 2.6 10*3/uL (ref 0.7–3.1)
Lymphs: 40 %
MCH: 29.7 pg (ref 26.6–33.0)
MCHC: 32.9 g/dL (ref 31.5–35.7)
MCV: 90 fL (ref 79–97)
Monocytes Absolute: 0.6 10*3/uL (ref 0.1–0.9)
Monocytes: 9 %
Neutrophils Absolute: 3.1 10*3/uL (ref 1.4–7.0)
Neutrophils: 47 %
Platelets: 257 10*3/uL (ref 150–450)
RBC: 4.51 x10E6/uL (ref 4.14–5.80)
RDW: 12.4 % (ref 11.6–15.4)
WBC: 6.6 10*3/uL (ref 3.4–10.8)

## 2022-04-21 LAB — LIPID PANEL
Chol/HDL Ratio: 5.4 ratio — ABNORMAL HIGH (ref 0.0–5.0)
Cholesterol, Total: 234 mg/dL — ABNORMAL HIGH (ref 100–199)
HDL: 43 mg/dL (ref >39)
LDL Chol Calc (NIH): 169 mg/dL — ABNORMAL HIGH (ref 0–99)
Triglycerides: 123 mg/dL (ref 0–149)
VLDL Cholesterol Cal: 22 mg/dL (ref 5–40)

## 2022-04-21 LAB — TESTOSTERONE,FREE AND TOTAL
Testosterone, Free: 6.2 pg/mL — ABNORMAL LOW (ref 7.2–24.0)
Testosterone: 166 ng/dL — ABNORMAL LOW (ref 264–916)

## 2022-04-26 NOTE — Telephone Encounter (Signed)
Please call wife for update on PA

## 2022-04-29 ENCOUNTER — Telehealth: Payer: Self-pay

## 2022-04-29 NOTE — Telephone Encounter (Signed)
Called and spoke with [facility] in [facility] and they stated that patient doesn't need rx since he got the testosterone filled there. They can drop by and they will give them the syringes and needles that they need. Patients wife  notified and verbalized understanding

## 2022-04-29 NOTE — Telephone Encounter (Signed)
Wife aware by vm

## 2022-04-29 NOTE — Telephone Encounter (Signed)
Sure that will be fine- please send in prescription for syringes and needles

## 2022-04-29 NOTE — Telephone Encounter (Signed)
Zamere Culton Key: [id number] - Rx #: [id number] Need help? Call us at [phone] Outcome Deniedon June 28 Your request has been denied Drug Testosterone Cypionate 200MG /ML intramuscular solution Form Blue Form (CB) Original Claim Info 75 PA REQUIRED--QUANTITY LIMIT MAY APPLY

## 2022-04-29 NOTE — Telephone Encounter (Signed)
Patient's wife called and would like to know if it is okay if she administers his testosterone injections.  They live in [place] and he works in [place] so it is not convenient for him to come here for them.  She has experience with injections and feels comfortable doing so.  If you agree, they will need syringes and needles sent to [facility].

## 2022-05-07 ENCOUNTER — Other Ambulatory Visit: Payer: Self-pay | Admitting: Nurse Practitioner

## 2022-05-07 DIAGNOSIS — M1A0790 Idiopathic chronic gout, unspecified ankle and foot, without tophus (tophi): Secondary | ICD-10-CM

## 2022-09-18 ENCOUNTER — Other Ambulatory Visit: Payer: Self-pay | Admitting: Nurse Practitioner

## 2022-10-18 ENCOUNTER — Ambulatory Visit (INDEPENDENT_AMBULATORY_CARE_PROVIDER_SITE_OTHER): Payer: BC Managed Care – PPO | Admitting: Nurse Practitioner

## 2022-10-18 ENCOUNTER — Encounter: Payer: Self-pay | Admitting: Nurse Practitioner

## 2022-10-18 VITALS — BP 140/77 | HR 68 | Temp 97.6°F | Resp 20 | Ht 74.0 in | Wt 256.0 lb

## 2022-10-18 DIAGNOSIS — I10 Essential (primary) hypertension: Secondary | ICD-10-CM | POA: Diagnosis not present

## 2022-10-18 DIAGNOSIS — E291 Testicular hypofunction: Secondary | ICD-10-CM

## 2022-10-18 DIAGNOSIS — E782 Mixed hyperlipidemia: Secondary | ICD-10-CM | POA: Diagnosis not present

## 2022-10-18 DIAGNOSIS — N529 Male erectile dysfunction, unspecified: Secondary | ICD-10-CM

## 2022-10-18 DIAGNOSIS — M1A0790 Idiopathic chronic gout, unspecified ankle and foot, without tophus (tophi): Secondary | ICD-10-CM

## 2022-10-18 DIAGNOSIS — F411 Generalized anxiety disorder: Secondary | ICD-10-CM | POA: Diagnosis not present

## 2022-10-18 DIAGNOSIS — F339 Major depressive disorder, recurrent, unspecified: Secondary | ICD-10-CM

## 2022-10-18 MED ORDER — ESCITALOPRAM OXALATE 20 MG PO TABS: 20.0000 mg | ORAL_TABLET | Freq: Every day | ORAL | 1 refills | Status: DC

## 2022-10-18 MED ORDER — ALLOPURINOL 100 MG PO TABS: 200.0000 mg | ORAL_TABLET | Freq: Every day | ORAL | 1 refills | Status: DC

## 2022-10-18 NOTE — Addendum Note (Signed)
Addended by: Bennie Pierini on: 10/18/2022 10:30 AM   Modules accepted: Orders

## 2022-10-18 NOTE — Patient Instructions (Signed)

## 2022-10-18 NOTE — Progress Notes (Addendum)
Subjective:    Patient ID: Anthony Chavez, male    DOB: 26-Mar-1961, 61 y.o.   MRN: [id number]   Chief Complaint: medical management of chronic issues     HPI:  Anthony Chavez is a 61 y.o. who identifies as a male who was assigned male at birth.   Social history: Lives with: wife and 2 sons Work history: works evening shift   Comes in today for follow up of the following chronic medical issues:  1. Essential hypertension, benign No c/o chest pain, sob or headache. Does not check blood sugar at home. BP Readings from Last 3 Encounters:  04/15/22 128/80  10/15/21 136/84  04/14/21 140/85     2. Mixed hyperlipidemia Does try to watch diet. Stays active but does not do any exercise. Lab Results  Component Value Date   CHOL 234 (H) 04/16/2022   HDL 43 04/16/2022   LDLCALC 169 (H) 04/16/2022   TRIG 123 04/16/2022   CHOLHDL 5.4 (H) 04/16/2022     3. Depression, recurrent ([facility]) Is on lexapro and is doing well    10/18/2022   10:16 AM 04/15/2022   10:11 AM 10/15/2021   10:50 AM  Depression screen PHQ 2/9  Decreased Interest 0 0 0  Down, Depressed, Hopeless 0 0 0  PHQ - 2 Score 0 0 0  Altered sleeping 0 0 0  Tired, decreased energy 0 0 0  Change in appetite 0 0 0  Feeling bad or failure about yourself  0 0 0  Trouble concentrating 0 0 0  Moving slowly or fidgety/restless 0 0 0  Suicidal thoughts 0 0 0  PHQ-9 Score 0 0 0  Difficult doing work/chores Not difficult at all Not difficult at all Not difficult at all     4. Generalized anxiety disorder    10/18/2022   10:16 AM 04/15/2022   10:12 AM 10/15/2021   10:50 AM 04/14/2021   12:41 PM  GAD 7 : Generalized Anxiety Score  Nervous, Anxious, on Edge 0 0 0 0  Control/stop worrying 0 0 0 0  Worry too much - different things 0 0 0 0  Trouble relaxing 0 0 0 0  Restless 0 0 0 0  Easily annoyed or irritable 0 0 0 0  Afraid - awful might happen 0 0 0 0  Total GAD 7 Score 0 0 0 0  Anxiety Difficulty Not  difficult at all Not difficult at all Not difficult at all Not difficult at all      5. Chronic gout of foot, unspecified cause, unspecified laterality Denies any recent gout flare ups  6. Erectile dysfunction, unspecified erectile dysfunction type Is on viagra and is doing well.   New complaints: none  No Known Allergies Outpatient Encounter Medications as of 10/18/2022  Medication Sig   allopurinol (ZYLOPRIM) 100 MG tablet Take 2 tablets (200 mg total) by mouth daily.   celecoxib (CELEBREX) 200 MG capsule TAKE 1 CAPSULE BY MOUTH TWICE A DAY (Patient not taking: Reported on 04/15/2022)   escitalopram (LEXAPRO) 20 MG tablet Take 1 tablet (20 mg total) by mouth daily.   folic acid (FOLVITE) 1 MG tablet Take 1 mg by mouth every morning. (Patient not taking: Reported on 01/13/2021)   magnesium oxide (MAG-OX) 400 (241.3 Mg) MG tablet Take 1 tablet by mouth 2 (two) times daily. (Patient not taking: Reported on 01/13/2021)   Melatonin 10 MG CAPS Take 1 capsule by mouth at bedtime. (Patient not taking: Reported on  04/15/2022)   sildenafil (VIAGRA) 50 MG tablet Take 1 tablet (50 mg total) by mouth daily as needed for erectile dysfunction. (Patient not taking: Reported on 04/15/2022)   testosterone cypionate (DEPOTESTOSTERONE CYPIONATE) 200 MG/ML injection INJECT 1 ML (200 MG TOTAL) INTO THE MUSCLE EVERY 14 DAYS **PA DENIED   thiamine 100 MG tablet Take 100 mg by mouth daily. (Patient not taking: Reported on 01/13/2021)   No facility-administered encounter medications on file as of 10/18/2022.    Past Surgical History:  Procedure Laterality Date   HERNIA REPAIR     ring finger surgery Left    TONSILLECTOMY      No family history on file.    Controlled substance contract: n/a     Review of Systems  Constitutional:  Negative for diaphoresis.  Eyes:  Negative for pain.  Respiratory:  Negative for shortness of breath.   Cardiovascular:  Negative for chest pain, palpitations and leg  swelling.  Gastrointestinal:  Negative for abdominal pain.  Endocrine: Negative for polydipsia.  Skin:  Negative for rash.  Neurological:  Negative for dizziness, weakness and headaches.  Hematological:  Does not bruise/bleed easily.  All other systems reviewed and are negative.      Objective:   Physical Exam Vitals and nursing note reviewed.  Constitutional:      Appearance: Normal appearance. He is well-developed.  HENT:     Head: Normocephalic.     Nose: Nose normal.     Mouth/Throat:     Mouth: Mucous membranes are moist.     Pharynx: Oropharynx is clear.  Eyes:     Pupils: Pupils are equal, round, and reactive to light.  Neck:     Thyroid: No thyroid mass or thyromegaly.     Vascular: No carotid bruit or JVD.     Trachea: Phonation normal.  Cardiovascular:     Rate and Rhythm: Normal rate and regular rhythm.  Pulmonary:     Effort: Pulmonary effort is normal. No respiratory distress.     Breath sounds: Normal breath sounds.  Abdominal:     General: Bowel sounds are normal.     Palpations: Abdomen is soft.     Tenderness: There is no abdominal tenderness.  Musculoskeletal:        General: Normal range of motion.     Cervical back: Normal range of motion and neck supple.  Lymphadenopathy:     Cervical: No cervical adenopathy.  Skin:    General: Skin is warm and dry.  Neurological:     Mental Status: He is alert and oriented to person, place, and time.  Psychiatric:        Behavior: Behavior normal.        Thought Content: Thought content normal.        Judgment: Judgment normal.     BP (!) 140/77   Pulse 68   Temp 97.6 F (36.4 C) (Temporal)   Resp 20   Ht 6\' 2"  (1.88 m)   Wt 256 lb (116.1 kg)   SpO2 96%   BMI 32.87 kg/m        Assessment & Plan:  TACUMA LAUERMAN comes in today with chief complaint of Medical Management of Chronic Issues   Diagnosis and orders addressed:  1. Essential hypertension, benign Low sodium diet - CBC with  Differential/Platelet - CMP14+EGFR  2. Mixed hyperlipidemia Low fat diet - Lipid panel  3. Depression, recurrent ([facility]) Stress management - escitalopram (LEXAPRO) 20 MG tablet; Take 1 tablet (20  mg total) by mouth daily.  Dispense: 90 tablet; Refill: 1  4. Generalized anxiety disorder  5. Chronic gout of foot, unspecified cause, unspecified laterality Low purine diet - allopurinol (ZYLOPRIM) 100 MG tablet; Take 2 tablets (200 mg total) by mouth daily.  Dispense: 180 tablet; Refill: 1  6. Erectile dysfunction, unspecified erectile dysfunction type Use viagra as needed  7. Hypogonadism Labs pending Will adjust meds as needed  Labs pending Health Maintenance reviewed Diet and exercise encouraged  Follow up plan: 6 months   Mary-Margaret Daphine Deutscher, FNP

## 2022-10-19 ENCOUNTER — Other Ambulatory Visit: Payer: BC Managed Care – PPO

## 2022-10-19 DIAGNOSIS — I10 Essential (primary) hypertension: Secondary | ICD-10-CM | POA: Diagnosis not present

## 2022-10-19 DIAGNOSIS — N529 Male erectile dysfunction, unspecified: Secondary | ICD-10-CM | POA: Diagnosis not present

## 2022-10-19 DIAGNOSIS — E782 Mixed hyperlipidemia: Secondary | ICD-10-CM | POA: Diagnosis not present

## 2022-10-20 ENCOUNTER — Other Ambulatory Visit (HOSPITAL_COMMUNITY): Payer: Self-pay

## 2022-10-20 ENCOUNTER — Telehealth: Payer: Self-pay

## 2022-10-20 MED ORDER — TESTOSTERONE 20.25 MG/ACT (1.62%) TD GEL: 1.0000 | Freq: Every day | TRANSDERMAL | 3 refills | Status: DC

## 2022-10-20 NOTE — Telephone Encounter (Signed)
Pharmacy Patient Advocate Encounter   Received notification from [facility] that prior authorization for Testosterone 20.25mg  (1.62% gel)  is required/requested.  Per Test Claim: Prior auth needed   PA submitted on 10/20/22 to (ins) [employer]  via [employer] [id number]  Status is pending

## 2022-10-20 NOTE — Telephone Encounter (Signed)
Pharmacy Patient Advocate Encounter   Received notification from [facility] that prior authorization for Testosterone 20.25mg  (1.62%) gel is required/requested.  Per Test Claim: A prior authorization is required   Please provide ICD-10 code to be put in for the prior auth.  Status is not sent

## 2022-10-20 NOTE — Addendum Note (Signed)
Addended by: Bennie Pierini on: 10/20/2022 07:52 AM   Modules accepted: Orders

## 2022-10-21 LAB — TESTOSTERONE,FREE AND TOTAL
Testosterone, Free: 9.1 pg/mL (ref 6.6–18.1)
Testosterone: 190 ng/dL — ABNORMAL LOW (ref 264–916)

## 2022-10-21 LAB — LIPID PANEL
Chol/HDL Ratio: 5.4 ratio — ABNORMAL HIGH (ref 0.0–5.0)
Cholesterol, Total: 260 mg/dL — ABNORMAL HIGH (ref 100–199)
HDL: 48 mg/dL (ref >39)
LDL Chol Calc (NIH): 189 mg/dL — ABNORMAL HIGH (ref 0–99)
Triglycerides: 129 mg/dL (ref 0–149)
VLDL Cholesterol Cal: 23 mg/dL (ref 5–40)

## 2022-10-21 LAB — CMP14+EGFR
ALT: 21 IU/L (ref 0–44)
AST: 22 IU/L (ref 0–40)
Albumin/Globulin Ratio: 2 (ref 1.2–2.2)
Albumin: 4.5 g/dL (ref 3.8–4.9)
Alkaline Phosphatase: 55 IU/L (ref 44–121)
BUN/Creatinine Ratio: 18 (ref 10–24)
BUN: 27 mg/dL (ref 8–27)
Bilirubin Total: 0.6 mg/dL (ref 0.0–1.2)
CO2: 23 mmol/L (ref 20–29)
Calcium: 9.4 mg/dL (ref 8.6–10.2)
Chloride: 101 mmol/L (ref 96–106)
Creatinine, Ser: 1.48 mg/dL — ABNORMAL HIGH (ref 0.76–1.27)
Globulin, Total: 2.3 g/dL (ref 1.5–4.5)
Glucose: 105 mg/dL — ABNORMAL HIGH (ref 70–99)
Potassium: 4.4 mmol/L (ref 3.5–5.2)
Sodium: 140 mmol/L (ref 134–144)
Total Protein: 6.8 g/dL (ref 6.0–8.5)
eGFR: 54 mL/min/{1.73_m2} — ABNORMAL LOW (ref >59)

## 2022-10-21 LAB — CBC WITH DIFFERENTIAL/PLATELET
Basophils Absolute: 0.1 10*3/uL (ref 0.0–0.2)
Basos: 1 %
EOS (ABSOLUTE): 0.2 10*3/uL (ref 0.0–0.4)
Eos: 2 %
Hematocrit: 47.2 % (ref 37.5–51.0)
Hemoglobin: 15.4 g/dL (ref 13.0–17.7)
Immature Grans (Abs): 0 10*3/uL (ref 0.0–0.1)
Immature Granulocytes: 0 %
Lymphocytes Absolute: 2.6 10*3/uL (ref 0.7–3.1)
Lymphs: 35 %
MCH: 29.5 pg (ref 26.6–33.0)
MCHC: 32.6 g/dL (ref 31.5–35.7)
MCV: 90 fL (ref 79–97)
Monocytes Absolute: 0.7 10*3/uL (ref 0.1–0.9)
Monocytes: 9 %
Neutrophils Absolute: 4 10*3/uL (ref 1.4–7.0)
Neutrophils: 53 %
Platelets: 244 10*3/uL (ref 150–450)
RBC: 5.22 x10E6/uL (ref 4.14–5.80)
RDW: 13 % (ref 11.6–15.4)
WBC: 7.6 10*3/uL (ref 3.4–10.8)

## 2022-10-22 ENCOUNTER — Other Ambulatory Visit (HOSPITAL_COMMUNITY): Payer: Self-pay

## 2022-10-22 NOTE — Telephone Encounter (Signed)
Patient Advocate Encounter  Prior Authorization for Testosteone 20.25mg  (1.62%) gel has been approved.    PA# Key: [id number] Effective dates: 10/20/22 through 10/19/23

## 2022-11-02 ENCOUNTER — Telehealth: Payer: Self-pay | Admitting: Nurse Practitioner

## 2022-11-02 MED ORDER — TESTOSTERONE CYPIONATE 200 MG/ML IM SOLN: INTRAMUSCULAR | 4 refills | Status: DC

## 2022-11-02 NOTE — Telephone Encounter (Signed)
Testosterone injection 234mh IM every 14 days- may need prior auth so may be a week before can get.  Meds ordered this encounter  Medications   testosterone cypionate (DEPOTESTOSTERONE CYPIONATE) 200 MG/ML injection    Sig: INJECT 1 ML (200 MG TOTAL) INTO THE MUSCLE EVERY 14 DAYS **PA DENIED    Dispense:  2 mL    Refill:  4    Not to exceed 5 additional fills before 10/17/2022.    Order Specific Question:   Supervising Provider    Answer:   Worthy Rancher [[id number]]   [place], FNP

## 2022-11-02 NOTE — Telephone Encounter (Signed)
Pt called stating that he was told by the pharmacy that even after insurance, he would still have to pay over $100 for the testosterone gel. Pt can't afford that. Wants to know if he can just do the testosterone injections weekly. Pt prefers the injections over the gel anyway.   Please advise and call patients wife.

## 2022-11-03 ENCOUNTER — Telehealth: Payer: Self-pay | Admitting: Nurse Practitioner

## 2022-11-03 NOTE — Telephone Encounter (Signed)
Wants testosterone sent in for once a week.

## 2022-11-03 NOTE — Telephone Encounter (Signed)
Patient needs clarification on testosterone shots. When he had labs he was told that he would start getting shots every week instead of every two but the pharmacy has a prescription for every two. Please call back to clarify.

## 2022-11-04 NOTE — Telephone Encounter (Signed)
Pt's wife aware.

## 2022-11-04 NOTE — Telephone Encounter (Signed)
I increased dose to 2mg  every 14 days from 1 mg- will see if insurance will approve. If not will try 1mg  every week.

## 2022-11-17 ENCOUNTER — Other Ambulatory Visit: Payer: Self-pay | Admitting: Nurse Practitioner

## 2022-11-17 MED ORDER — TESTOSTERONE CYPIONATE 200 MG/ML IM SOLN: 100.0000 mg | INTRAMUSCULAR | 4 refills | Status: DC

## 2022-11-17 NOTE — Telephone Encounter (Signed)
Pt wants to take weekly testosterone injections. He does not want to have injection every 2 weeks.

## 2022-11-17 NOTE — Telephone Encounter (Signed)
That is fine 

## 2022-11-17 NOTE — Telephone Encounter (Signed)
Wife informed. States that pt will do the weekly injections himself.

## 2022-11-17 NOTE — Telephone Encounter (Signed)
Patient calling to check on status of this.

## 2022-11-26 ENCOUNTER — Telehealth (INDEPENDENT_AMBULATORY_CARE_PROVIDER_SITE_OTHER): Payer: BC Managed Care – PPO | Admitting: Nurse Practitioner

## 2022-11-26 ENCOUNTER — Encounter: Payer: Self-pay | Admitting: Nurse Practitioner

## 2022-11-26 DIAGNOSIS — M10061 Idiopathic gout, right knee: Secondary | ICD-10-CM

## 2022-11-26 MED ORDER — PREDNISONE 20 MG PO TABS: 40.0000 mg | ORAL_TABLET | Freq: Every day | ORAL | 0 refills | Status: AC

## 2022-11-26 NOTE — Progress Notes (Signed)
Virtual Visit Consent   Anthony Chavez, you are scheduled for a virtual visit with Mary-Margaret Hassell Done, FNP, a [facility] provider, today.     Just as with appointments in the office, your consent must be obtained to participate.  Your consent will be active for this visit and any virtual visit you may have with one of our providers in the next 365 days.     If you have a MyChart account, a copy of this consent can be sent to you electronically.  All virtual visits are billed to your insurance company just like a traditional visit in the office.    As this is a virtual visit, video technology does not allow for your provider to perform a traditional examination.  This may limit your provider's ability to fully assess your condition.  If your provider identifies any concerns that need to be evaluated in person or the need to arrange testing (such as labs, EKG, etc.), we will make arrangements to do so.     Although advances in technology are sophisticated, we cannot ensure that it will always work on either your end or our end.  If the connection with a video visit is poor, the visit may have to be switched to a telephone visit.  With either a video or telephone visit, we are not always able to ensure that we have a secure connection.     I need to obtain your verbal consent now.   Are you willing to proceed with your visit today? YES   Anthony Chavez has provided verbal consent on 11/26/2022 for a virtual visit (video or telephone).   Mary-Margaret Hassell Done, FNP   Date: 11/26/2022 8:28 AM   Virtual Visit via Video Note   I, Mary-Margaret Hassell Done, connected with Anthony Chavez ([id number], 02/25/1961) on 11/26/22 at  8:45 AM EST by a video-enabled telemedicine application and verified that I am speaking with the correct person using two identifiers.  Location: Patient: Virtual Visit Location Patient: Home Provider: Virtual Visit Location Provider: Mobile   I discussed the limitations  of evaluation and management by telemedicine and the availability of in person appointments. The patient expressed understanding and agreed to proceed.    History of Present Illness: Anthony Chavez is a 62 y.o. who identifies as a male who was assigned male at birth, and is being seen today for gout.  HPI: Patient c/o gout flare up. C/o pain right kne  pain that started on Wednesday. Has swollen and hurts like a tooth ache. He has history of gout. He had some prednisone in the cabinet and took 2 of them and is now 50% better.     Review of Systems  Musculoskeletal:  Positive for joint pain (right knee).    Problems:  Patient Active Problem List   Diagnosis Date Noted   Generalized anxiety disorder 05/03/2019   Depression, recurrent ([facility]) 04/03/2019   Erectile dysfunction 08/25/2015   Essential hypertension, benign 08/21/2014   Hyperlipidemia 08/21/2014   Gout 08/21/2014    Allergies: No Known Allergies Medications:  Current Outpatient Medications:    allopurinol (ZYLOPRIM) 100 MG tablet, Take 2 tablets (200 mg total) by mouth daily., Disp: 180 tablet, Rfl: 1   celecoxib (CELEBREX) 200 MG capsule, TAKE 1 CAPSULE BY MOUTH TWICE A DAY (Patient not taking: Reported on 04/15/2022), Disp: 60 capsule, Rfl: 1   escitalopram (LEXAPRO) 20 MG tablet, Take 1 tablet (20 mg total) by mouth daily., Disp: 90 tablet, Rfl:  1   folic acid (FOLVITE) 1 MG tablet, Take 1 mg by mouth every morning. (Patient not taking: Reported on 01/13/2021), Disp: , Rfl:    magnesium oxide (MAG-OX) 400 (241.3 Mg) MG tablet, Take 1 tablet by mouth 2 (two) times daily. (Patient not taking: Reported on 01/13/2021), Disp: , Rfl:    Melatonin 10 MG CAPS, Take 1 capsule by mouth at bedtime. (Patient not taking: Reported on 04/15/2022), Disp: , Rfl:    sildenafil (VIAGRA) 50 MG tablet, Take 1 tablet (50 mg total) by mouth daily as needed for erectile dysfunction. (Patient not taking: Reported on 04/15/2022), Disp: 10 tablet, Rfl:  6   testosterone cypionate (DEPOTESTOSTERONE CYPIONATE) 200 MG/ML injection, Inject 0.5 mLs (100 mg total) into the muscle once a week. INJECT 1 ML (200 MG TOTAL) INTO THE MUSCLE EVERY 14 DAYS **PA DENIED, Disp: 2 mL, Rfl: 4   thiamine 100 MG tablet, Take 100 mg by mouth daily. (Patient not taking: Reported on 01/13/2021), Disp: , Rfl:   Observations/Objective: Patient is well-developed, well-nourished in no acute distress.  Resting comfortably  at home.  Head is normocephalic, atraumatic.  No labored breathing.  Speech is clear and coherent with logical content.  Patient is alert and oriented at baseline.  Right knee swollen- erythema and heat resolving.  Assessment and Plan:  Anthony Chavez in today with chief complaint of No chief complaint on file.   1. Acute idiopathic gout of right knee Rest Ice BID  Meds ordered this encounter  Medications   predniSONE (DELTASONE) 20 MG tablet    Sig: Take 2 tablets (40 mg total) by mouth daily with breakfast for 5 days. 2 po daily for 5 days    Dispense:  10 tablet    Refill:  0    Order Specific Question:   Supervising Provider    Answer:   Caryl Pina A [[id number]]       Follow Up Instructions: I discussed the assessment and treatment plan with the patient. The patient was provided an opportunity to ask questions and all were answered. The patient agreed with the plan and demonstrated an understanding of the instructions.  A copy of instructions were sent to the patient via MyChart.  The patient was advised to call back or seek an in-person evaluation if the symptoms worsen or if the condition fails to improve as anticipated.  Time:  I spent 7 minutes with the patient via telehealth technology discussing the above problems/concerns.    Mary-Margaret Hassell Done, FNP

## 2022-11-26 NOTE — Patient Instructions (Signed)
Gout  Gout is painful swelling of your joints. Gout is a type of arthritis. It is caused by having too much uric acid in your body. Uric acid is a chemical that is made when your body breaks down substances called purines. If your body has too much uric acid, sharp crystals can form and build up in your joints. This causes pain and swelling. Gout attacks can happen quickly and be very painful (acute gout). Over time, the attacks can affect more joints and happen more often (chronic gout). What are the causes? Gout is caused by too much uric acid in your blood. This can happen because: Your kidneys do not remove enough uric acid from your blood. Your body makes too much uric acid. You eat too many foods that are high in purines. These foods include organ meats, some seafood, and beer. Trauma or stress can bring on an attack. What increases the risk? Having a family history of gout. Being male and middle-aged. Being male and having gone through menopause. Having an organ transplant. Taking certain medicines. Having certain conditions, such as: Being very overweight (obese). Lead poisoning. Kidney disease. A skin condition called psoriasis. Other risks include: Losing weight too quickly. Not having enough water in the body (being dehydrated). Drinking alcohol, especially beer. Drinking beverages that are sweetened with a type of sugar called fructose. What are the signs or symptoms? An attack of acute gout often starts at night and usually happens in just one joint. The most common place is the big toe. Other joints that may be affected include joints of the feet, ankle, knee, fingers, wrist, or elbow. Symptoms may include: Very bad pain. Warmth. Swelling. Stiffness. Tenderness. The affected joint may be very painful to touch. Shiny, red, or purple skin. Chills and fever. Chronic gout may cause symptoms more often. More joints may be involved. You may also have white or yellow lumps  (tophi) on your hands or feet or in other areas near your joints. How is this treated? Treatment for an acute attack may include medicines for pain and swelling, such as: NSAIDs, such as ibuprofen. Steroids taken by mouth or injected into a joint. Colchicine. This can be given by mouth or through an IV tube. Treatment to prevent future attacks may include: Taking small doses of NSAIDs or colchicine daily. Using a medicine that reduces uric acid levels in your blood, such as allopurinol. Making changes to your diet. You may need to see a food expert (dietitian) about what to eat and drink to prevent gout. Follow these instructions at home: During a gout attack  If told, put ice on the painful area. To do this: Put ice in a plastic bag. Place a towel between your skin and the bag. Leave the ice on for 20 minutes, 2-3 times a day. Take off the ice if your skin turns bright red. This is very important. If you cannot feel pain, heat, or cold, you have a greater risk of damage to the area. Raise the painful joint above the level of your heart as often as you can. Rest the joint as much as possible. If the joint is in your leg, you may be given crutches. Follow instructions from your doctor about what you cannot eat or drink. Avoiding future gout attacks Eat a low-purine diet. Avoid foods and drinks such as: Liver. Kidney. Anchovies. Asparagus. Herring. Mushrooms. Mussels. Beer. Stay at a healthy weight. If you want to lose weight, talk with your doctor. Do not  lose weight too fast. Start or continue an exercise plan as told by your doctor. Eating and drinking Avoid drinks sweetened by fructose. Drink enough fluids to keep your pee (urine) pale yellow. If you drink alcohol: Limit how much you have to: 0-1 drink a day for women who are not pregnant. 0-2 drinks a day for men. Know how much alcohol is in a drink. In the [place] one drink equals one 12 oz bottle of beer (355 mL), one 5 oz  glass of wine (148 mL), or one 1 oz glass of hard liquor (44 mL). General instructions Take over-the-counter and prescription medicines only as told by your doctor. Ask your doctor if you should avoid driving or using machines while you are taking your medicine. Return to your normal activities when your doctor says that it is safe. Keep all follow-up visits. Where to find more information [employer] [facility] [employer]: www.niams.[employer].es Contact a doctor if: You have another gout attack. You still have symptoms of a gout attack after 10 days of treatment. You have problems (side effects) because of your medicines. You have chills or a fever. You have burning pain when you pee (urinate). You have pain in your lower back or belly. Get help right away if: You have very bad pain. Your pain cannot be controlled. You cannot pee. Summary Gout is painful swelling of the joints. The most common site of pain is the big toe, but it can affect other joints. Medicines and avoiding some foods can help to prevent and treat gout attacks. This information is not intended to replace advice given to you by your health care provider. Make sure you discuss any questions you have with your health care provider. Document Revised: 07/22/2021 Document Reviewed: 07/22/2021 Elsevier Patient Education  [place].

## 2023-03-03 ENCOUNTER — Other Ambulatory Visit: Payer: Self-pay | Admitting: Nurse Practitioner

## 2023-04-12 DIAGNOSIS — M25512 Pain in left shoulder: Secondary | ICD-10-CM | POA: Diagnosis not present

## 2023-04-12 DIAGNOSIS — M25511 Pain in right shoulder: Secondary | ICD-10-CM | POA: Diagnosis not present

## 2023-04-12 DIAGNOSIS — M5451 Vertebrogenic low back pain: Secondary | ICD-10-CM | POA: Diagnosis not present

## 2023-04-17 DIAGNOSIS — W208XXA Other cause of strike by thrown, projected or falling object, initial encounter: Secondary | ICD-10-CM | POA: Diagnosis not present

## 2023-04-17 DIAGNOSIS — M7732 Calcaneal spur, left foot: Secondary | ICD-10-CM | POA: Diagnosis not present

## 2023-04-17 DIAGNOSIS — M19072 Primary osteoarthritis, left ankle and foot: Secondary | ICD-10-CM | POA: Diagnosis not present

## 2023-04-17 DIAGNOSIS — Z23 Encounter for immunization: Secondary | ICD-10-CM | POA: Diagnosis not present

## 2023-04-17 DIAGNOSIS — S92532A Displaced fracture of distal phalanx of left lesser toe(s), initial encounter for closed fracture: Secondary | ICD-10-CM | POA: Diagnosis not present

## 2023-04-17 DIAGNOSIS — S99922A Unspecified injury of left foot, initial encounter: Secondary | ICD-10-CM | POA: Diagnosis not present

## 2023-04-17 DIAGNOSIS — S98221A Partial traumatic amputation of two or more right lesser toes, initial encounter: Secondary | ICD-10-CM | POA: Diagnosis not present

## 2023-04-20 DIAGNOSIS — R52 Pain, unspecified: Secondary | ICD-10-CM | POA: Diagnosis not present

## 2023-04-20 DIAGNOSIS — S92532S Displaced fracture of distal phalanx of left lesser toe(s), sequela: Secondary | ICD-10-CM | POA: Diagnosis not present

## 2023-04-21 ENCOUNTER — Ambulatory Visit: Payer: BC Managed Care – PPO | Admitting: Nurse Practitioner

## 2023-04-25 ENCOUNTER — Encounter: Payer: Self-pay | Admitting: Nurse Practitioner

## 2023-05-16 ENCOUNTER — Ambulatory Visit: Payer: BC Managed Care – PPO | Admitting: Nurse Practitioner

## 2023-05-16 ENCOUNTER — Other Ambulatory Visit: Payer: Self-pay | Admitting: Nurse Practitioner

## 2023-05-16 ENCOUNTER — Encounter: Payer: Self-pay | Admitting: Nurse Practitioner

## 2023-05-16 VITALS — BP 156/87 | HR 79 | Temp 97.9°F | Resp 20 | Ht 74.0 in | Wt 259.0 lb

## 2023-05-16 DIAGNOSIS — I10 Essential (primary) hypertension: Secondary | ICD-10-CM | POA: Diagnosis not present

## 2023-05-16 DIAGNOSIS — F411 Generalized anxiety disorder: Secondary | ICD-10-CM | POA: Diagnosis not present

## 2023-05-16 DIAGNOSIS — E782 Mixed hyperlipidemia: Secondary | ICD-10-CM

## 2023-05-16 DIAGNOSIS — L089 Local infection of the skin and subcutaneous tissue, unspecified: Secondary | ICD-10-CM

## 2023-05-16 DIAGNOSIS — F339 Major depressive disorder, recurrent, unspecified: Secondary | ICD-10-CM

## 2023-05-16 MED ORDER — ROSUVASTATIN CALCIUM 10 MG PO TABS: 10.0000 mg | ORAL_TABLET | Freq: Every day | ORAL | 1 refills | Status: DC

## 2023-05-16 MED ORDER — SULFAMETHOXAZOLE-TRIMETHOPRIM 800-160 MG PO TABS: 1.0000 | ORAL_TABLET | Freq: Two times a day (BID) | ORAL | 0 refills | Status: DC

## 2023-05-16 MED ORDER — ESCITALOPRAM OXALATE 20 MG PO TABS
20.0000 mg | ORAL_TABLET | Freq: Every day | ORAL | 1 refills | Status: DC
Start: 2023-05-16 — End: 2023-11-08

## 2023-05-16 NOTE — Progress Notes (Signed)
Subjective:    Patient ID: Anthony Chavez, male    DOB: 10/10/1961, 62 y.o.   MRN: [id number]   Chief Complaint: medical management of chronic issues     HPI:  Anthony Chavez is a 62 y.o. who identifies as a male who was assigned male at birth.   Social history: Lives with: wife and 2 sons Work history: works evening sifts   Comes in today for follow up of the following chronic medical issues:  1. Essential hypertension, benign Chavez c/o chest pain, sob or headache. Does not check blood pressure at home. BP Readings from Last 3 Encounters:  10/18/22 (!) 140/77  04/15/22 128/80  10/15/21 136/84     2. Mixed hyperlipidemia Does try to watch diet and stays very active. Lab Results  Component Value Date   CHOL 260 (H) 10/19/2022   HDL 48 10/19/2022   LDLCALC 189 (H) 10/19/2022   TRIG 129 10/19/2022   CHOLHDL 5.4 (H) 10/19/2022   The 10-year ASCVD risk score (Arnett DK, et al., 2019) is: 28.8%   3. Depression, recurrent (HCC) Has been on lexapro for several years. Doing well.    10/18/2022   10:16 AM 04/15/2022   10:11 AM 10/15/2021   10:50 AM  Depression screen PHQ 2/9  Decreased Interest 0 0 0  Down, Depressed, Hopeless 0 0 0  PHQ - 2 Score 0 0 0  Altered sleeping 0 0 0  Tired, decreased energy 0 0 0  Change in appetite 0 0 0  Feeling bad or failure about yourself  0 0 0  Trouble concentrating 0 0 0  Moving slowly or fidgety/restless 0 0 0  Suicidal thoughts 0 0 0  PHQ-9 Score 0 0 0  Difficult doing work/chores Not difficult at all Not difficult at all Not difficult at all     4. Generalized anxiety disorder Has had anxiety for years. Lexapro really helps.    10/18/2022   10:16 AM 04/15/2022   10:12 AM 10/15/2021   10:50 AM 04/14/2021   12:41 PM  GAD 7 : Generalized Anxiety Score  Nervous, Anxious, on Edge 0 0 0 0  Control/stop worrying 0 0 0 0  Worry too much - different things 0 0 0 0  Trouble relaxing 0 0 0 0  Restless 0 0 0 0  Easily  annoyed or irritable 0 0 0 0  Afraid - awful might happen 0 0 0 0  Total GAD 7 Score 0 0 0 0  Anxiety Difficulty Not difficult at all Not difficult at all Not difficult at all Not difficult at all       New complaints: On june 16 he had a toe injury and had to have the tip of toe amputated. He did not follow up like he was suppose to. Wants someone to look at his toe .  Chavez Known Allergies Outpatient Encounter Medications as of 05/16/2023  Medication Sig   allopurinol (ZYLOPRIM) 100 MG tablet Take 2 tablets (200 mg total) by mouth daily.   celecoxib (CELEBREX) 200 MG capsule TAKE 1 CAPSULE BY MOUTH TWICE A DAY (Patient not taking: Reported on 04/15/2022)   escitalopram (LEXAPRO) 20 MG tablet Take 1 tablet (20 mg total) by mouth daily.   folic acid (FOLVITE) 1 MG tablet Take 1 mg by mouth every morning. (Patient not taking: Reported on 01/13/2021)   magnesium oxide (MAG-OX) 400 (241.3 Mg) MG tablet Take 1 tablet by mouth 2 (two) times daily. (Patient not  taking: Reported on 01/13/2021)   Melatonin 10 MG CAPS Take 1 capsule by mouth at bedtime. (Patient not taking: Reported on 04/15/2022)   sildenafil (VIAGRA) 50 MG tablet Take 1 tablet (50 mg total) by mouth daily as needed for erectile dysfunction. (Patient not taking: Reported on 04/15/2022)   testosterone cypionate (DEPOTESTOSTERONE CYPIONATE) 200 MG/ML injection INJECT 0.5 MLS INTO THE MUSCLE ONCE A WEEK **DISCARD AFTER USE-SINGLE USE VIAL**   thiamine 100 MG tablet Take 100 mg by mouth daily. (Patient not taking: Reported on 01/13/2021)   Chavez facility-administered encounter medications on file as of 05/16/2023.    Past Surgical History:  Procedure Laterality Date   HERNIA REPAIR     ring finger surgery Left    TONSILLECTOMY      Chavez family history on file.    Controlled substance contract: n/a     Review of Systems  Constitutional:  Negative for diaphoresis.  Eyes:  Negative for pain.  Respiratory:  Negative for shortness of  breath.   Cardiovascular:  Negative for chest pain, palpitations and leg swelling.  Gastrointestinal:  Negative for abdominal pain.  Endocrine: Negative for polydipsia.  Skin:  Negative for rash.  Neurological:  Negative for dizziness, weakness and headaches.  Hematological:  Does not bruise/bleed easily.  All other systems reviewed and are negative.      Objective:   Physical Exam Vitals and nursing note reviewed.  Constitutional:      Appearance: Normal appearance. He is well-developed.  HENT:     Head: Normocephalic.     Nose: Nose normal.     Mouth/Throat:     Mouth: Mucous membranes are moist.     Pharynx: Oropharynx is clear.  Eyes:     Pupils: Pupils are equal, round, and reactive to light.  Neck:     Thyroid: Chavez thyroid mass or thyromegaly.     Vascular: Chavez carotid bruit or JVD.     Trachea: Phonation normal.  Cardiovascular:     Rate and Rhythm: Normal rate and regular rhythm.  Pulmonary:     Effort: Pulmonary effort is normal. Chavez respiratory distress.     Breath sounds: Normal breath sounds.  Abdominal:     General: Bowel sounds are normal.     Palpations: Abdomen is soft.     Tenderness: There is Chavez abdominal tenderness.  Musculoskeletal:        General: Normal range of motion.     Cervical back: Normal range of motion and neck supple.     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 Mild erythema and warm to touch.  Lymphadenopathy:     Cervical: Chavez cervical adenopathy.  Skin:    General: Skin is warm and dry.  Neurological:     Mental Status: He is alert and oriented to person, place, and time.  Psychiatric:        Behavior: Behavior normal.        Thought Content: Thought content normal.        Judgment: Judgment normal.    BP (!) 156/87   Pulse 79   Temp 97.9 F (36.6 C) (Temporal)   Resp 20   Ht 6\' 2"  (1.88 m)   Wt 259 lb (117.5 kg)   SpO2 97%   BMI 33.25 kg/m         Assessment & Plan:    Anthony Chavez comes in today  with chief complaint of Medical Management of Chronic Issues   Diagnosis and  orders addressed:  1. Essential hypertension, benign Low sodium diet - CBC with Differential/Platelet - CMP14+EGFR  2. Mixed hyperlipidemia Low fat diet - rosuvastatin (CRESTOR) 10 MG tablet; Take 1 tablet (10 mg total) by mouth daily.  Dispense: 90 tablet; Refill: 1 - Lipid panel  3. Depression, recurrent (HCC) Stress manageemnt - escitalopram (LEXAPRO) 20 MG tablet; Take 1 tablet (20 mg total) by mouth daily.  Dispense: 90 tablet; Refill: 1  4. Generalized anxiety disorder  5. Toe infection Keep covered Epsom salt soaks BID Keep clean and dry - sulfamethoxazole-trimethoprim (BACTRIM DS) 800-160 MG tablet; Take 1 tablet by mouth 2 (two) times daily.  Dispense: 20 tablet; Refill: 0   Labs pending Health Maintenance reviewed Diet and exercise encouraged  Follow up plan: 6 months   Mary-Margaret Daphine Deutscher, FNP

## 2023-05-16 NOTE — Patient Instructions (Signed)

## 2023-05-17 LAB — CBC WITH DIFFERENTIAL/PLATELET
Basophils Absolute: 0.1 10*3/uL (ref 0.0–0.2)
Basos: 1 %
EOS (ABSOLUTE): 0.1 10*3/uL (ref 0.0–0.4)
Eos: 1 %
Hematocrit: 48.1 % (ref 37.5–51.0)
Hemoglobin: 16.2 g/dL (ref 13.0–17.7)
Immature Grans (Abs): 0 10*3/uL (ref 0.0–0.1)
Immature Granulocytes: 0 %
Lymphocytes Absolute: 1.8 10*3/uL (ref 0.7–3.1)
Lymphs: 17 %
MCH: 30.5 pg (ref 26.6–33.0)
MCHC: 33.7 g/dL (ref 31.5–35.7)
MCV: 91 fL (ref 79–97)
Monocytes Absolute: 0.7 10*3/uL (ref 0.1–0.9)
Monocytes: 7 %
Neutrophils Absolute: 8.1 10*3/uL — ABNORMAL HIGH (ref 1.4–7.0)
Neutrophils: 74 %
Platelets: 241 10*3/uL (ref 150–450)
RBC: 5.31 x10E6/uL (ref 4.14–5.80)
RDW: 13 % (ref 11.6–15.4)
WBC: 10.8 10*3/uL (ref 3.4–10.8)

## 2023-05-17 LAB — CMP14+EGFR
ALT: 31 IU/L (ref 0–44)
AST: 26 IU/L (ref 0–40)
Albumin: 4.2 g/dL (ref 3.9–4.9)
Alkaline Phosphatase: 50 IU/L (ref 44–121)
BUN/Creatinine Ratio: 14 (ref 10–24)
BUN: 20 mg/dL (ref 8–27)
Bilirubin Total: 0.7 mg/dL (ref 0.0–1.2)
CO2: 25 mmol/L (ref 20–29)
Calcium: 8.9 mg/dL (ref 8.6–10.2)
Chloride: 101 mmol/L (ref 96–106)
Creatinine, Ser: 1.47 mg/dL — ABNORMAL HIGH (ref 0.76–1.27)
Globulin, Total: 2.4 g/dL (ref 1.5–4.5)
Glucose: 93 mg/dL (ref 70–99)
Potassium: 4.7 mmol/L (ref 3.5–5.2)
Sodium: 139 mmol/L (ref 134–144)
Total Protein: 6.6 g/dL (ref 6.0–8.5)
eGFR: 54 mL/min/{1.73_m2} — ABNORMAL LOW (ref >59)

## 2023-05-17 LAB — LIPID PANEL
Chol/HDL Ratio: 4.1 ratio (ref 0.0–5.0)
Cholesterol, Total: 221 mg/dL — ABNORMAL HIGH (ref 100–199)
HDL: 54 mg/dL (ref >39)
LDL Chol Calc (NIH): 151 mg/dL — ABNORMAL HIGH (ref 0–99)
Triglycerides: 91 mg/dL (ref 0–149)
VLDL Cholesterol Cal: 16 mg/dL (ref 5–40)

## 2023-06-23 ENCOUNTER — Other Ambulatory Visit: Payer: Self-pay | Admitting: Nurse Practitioner

## 2023-08-05 ENCOUNTER — Other Ambulatory Visit: Payer: Self-pay | Admitting: Nurse Practitioner

## 2023-09-01 ENCOUNTER — Other Ambulatory Visit: Payer: Self-pay | Admitting: Nurse Practitioner

## 2023-09-07 ENCOUNTER — Other Ambulatory Visit: Payer: Self-pay | Admitting: Nurse Practitioner

## 2023-09-30 ENCOUNTER — Other Ambulatory Visit: Payer: Self-pay | Admitting: Nurse Practitioner

## 2023-10-03 MED ORDER — TESTOSTERONE CYPIONATE 200 MG/ML IM SOLN: 200.0000 mg | INTRAMUSCULAR | 0 refills | Status: DC

## 2023-10-03 NOTE — Telephone Encounter (Signed)
Copied from CRM [id number]. Topic: Clinical - Prescription Issue >> Oct 03, 2023  9:40 AM Orinda Kenner C wrote: Reason for CRM: Pt contacted the pharmacy, however the pharmacy directed pt to contact the office for refill of testosterone cypionate (DEPOTESTOSTERONE CYPIONATE) 200 MG/ML injection. Pt would like a c/b at [phone] on why the rx cannot be refilled.  [id number] - [place], [place] - [address] [place]  [id number] Phone:[phone]

## 2023-11-08 ENCOUNTER — Ambulatory Visit: Payer: BC Managed Care – PPO | Admitting: Nurse Practitioner

## 2023-11-08 ENCOUNTER — Encounter: Payer: Self-pay | Admitting: Nurse Practitioner

## 2023-11-08 VITALS — BP 187/100 | HR 76 | Temp 97.4°F | Resp 20 | Ht 74.0 in | Wt 259.0 lb

## 2023-11-08 DIAGNOSIS — F339 Major depressive disorder, recurrent, unspecified: Secondary | ICD-10-CM | POA: Diagnosis not present

## 2023-11-08 DIAGNOSIS — I10 Essential (primary) hypertension: Secondary | ICD-10-CM | POA: Diagnosis not present

## 2023-11-08 DIAGNOSIS — E291 Testicular hypofunction: Secondary | ICD-10-CM

## 2023-11-08 DIAGNOSIS — E782 Mixed hyperlipidemia: Secondary | ICD-10-CM

## 2023-11-08 DIAGNOSIS — M7711 Lateral epicondylitis, right elbow: Secondary | ICD-10-CM | POA: Diagnosis not present

## 2023-11-08 DIAGNOSIS — F411 Generalized anxiety disorder: Secondary | ICD-10-CM

## 2023-11-08 MED ORDER — TESTOSTERONE CYPIONATE 200 MG/ML IM SOLN: 200.0000 mg | INTRAMUSCULAR | 5 refills | Status: DC

## 2023-11-08 MED ORDER — LISINOPRIL 20 MG PO TABS: 20.0000 mg | ORAL_TABLET | Freq: Every day | ORAL | 3 refills | Status: DC

## 2023-11-08 NOTE — Patient Instructions (Signed)

## 2023-11-08 NOTE — Progress Notes (Addendum)
 Subjective:    Patient ID: Anthony Chavez, male    DOB: 1961/10/05, 63 y.o.   MRN: [id number]   Chief Complaint: medical management of chronic issues     HPI:  Anthony Chavez is a 63 y.o. who identifies as a male who was assigned male at birth.   Social history: Lives with: wife and 2 sons Work history: works evening sifts   Comes in today for follow up of the following chronic medical issues:  1. Essential hypertension, benign No c/o chest pain, sob or headache. Does not check blood pressure at home. BP Readings from Last 3 Encounters:  11/08/23 (!) 187/100  05/16/23 (!) 156/87  10/18/22 (!) 140/77     2. Mixed hyperlipidemia Does try to watch diet and stays very active. He stopped taking his statin. Lab Results  Component Value Date   CHOL 221 (H) 05/16/2023   HDL 54 05/16/2023   LDLCALC 151 (H) 05/16/2023   TRIG 91 05/16/2023   CHOLHDL 4.1 05/16/2023   The 10-year ASCVD risk score (Arnett DK, et al., 2019) is: 21%   3. Depression, recurrent (HCC) Has been on lexapro  for several years. Doing well.    11/08/2023   11:15 AM 05/16/2023   12:02 PM 10/18/2022   10:16 AM  Depression screen PHQ 2/9  Decreased Interest 0 0 0  Down, Depressed, Hopeless 0 0 0  PHQ - 2 Score 0 0 0  Altered sleeping 1 1 0  Tired, decreased energy 1 1 0  Change in appetite 0 0 0  Feeling bad or failure about yourself  0 0 0  Trouble concentrating 0 0 0  Moving slowly or fidgety/restless 0 0 0  Suicidal thoughts 0 0 0  PHQ-9 Score 2 2 0  Difficult doing work/chores Not difficult at all Not difficult at all Not difficult at all     4. Generalized anxiety disorder Has had anxiety for years. Lexapro  really helps.    11/08/2023   11:15 AM 05/16/2023   12:02 PM 10/18/2022   10:16 AM 04/15/2022   10:12 AM  GAD 7 : Generalized Anxiety Score  Nervous, Anxious, on Edge 0 0 0 0  Control/stop worrying 0 0 0 0  Worry too much - different things 0 0 0 0  Trouble relaxing 0 0 0 0   Restless 0 0 0 0  Easily annoyed or irritable 0 0 0 0  Afraid - awful might happen 0 0 0 0  Total GAD 7 Score 0 0 0 0  Anxiety Difficulty Not difficult at all Not difficult at all Not difficult at all Not difficult at all    5. Hypogonadism Is on testosterone  injections every 14 days. Lab Results  Component Value Date   TESTOSTERONE  190 (L) 10/19/2022     New complaints: Patient lifts weight 3 days a week and has developed pain in right elbow. Hurts mainly when he is using his arm.   No Known Allergies Outpatient Encounter Medications as of 11/08/2023  Medication Sig   folic acid  (FOLVITE ) 1 MG tablet Take 1 mg by mouth every morning.   magnesium oxide (MAG-OX) 400 (241.3 Mg) MG tablet Take 1 tablet by mouth 2 (two) times daily.   Melatonin 10 MG CAPS Take 1 capsule by mouth at bedtime.   testosterone  cypionate (DEPOTESTOSTERONE CYPIONATE) 200 MG/ML injection Inject 1 mL (200 mg total) into the skin every 14 (fourteen) days.   thiamine  100 MG tablet Take 100 mg by  mouth daily.   [DISCONTINUED] allopurinol  (ZYLOPRIM ) 100 MG tablet Take 2 tablets (200 mg total) by mouth daily.   [DISCONTINUED] celecoxib  (CELEBREX ) 200 MG capsule TAKE 1 CAPSULE BY MOUTH TWICE A DAY (Patient not taking: Reported on 04/15/2022)   [DISCONTINUED] escitalopram  (LEXAPRO ) 20 MG tablet Take 1 tablet (20 mg total) by mouth daily.   [DISCONTINUED] rosuvastatin  (CRESTOR ) 10 MG tablet Take 1 tablet (10 mg total) by mouth daily.   [DISCONTINUED] sildenafil  (VIAGRA ) 50 MG tablet Take 1 tablet (50 mg total) by mouth daily as needed for erectile dysfunction. (Patient not taking: Reported on 11/08/2023)   [DISCONTINUED] sulfamethoxazole -trimethoprim  (BACTRIM  DS) 800-160 MG tablet Take 1 tablet by mouth 2 (two) times daily.   No facility-administered encounter medications on file as of 11/08/2023.    Past Surgical History:  Procedure Laterality Date   HERNIA REPAIR     ring finger surgery Left    TONSILLECTOMY       No family history on file.    Controlled substance contract: n/a     Review of Systems  Constitutional:  Negative for diaphoresis.  Eyes:  Negative for pain.  Respiratory:  Negative for shortness of breath.   Cardiovascular:  Negative for chest pain, palpitations and leg swelling.  Gastrointestinal:  Negative for abdominal pain.  Endocrine: Negative for polydipsia.  Skin:  Negative for rash.  Neurological:  Negative for dizziness, weakness and headaches.  Hematological:  Does not bruise/bleed easily.  All other systems reviewed and are negative.      Objective:   Physical Exam Vitals and nursing note reviewed.  Constitutional:      Appearance: Normal appearance. He is well-developed.  HENT:     Head: Normocephalic.     Nose: Nose normal.     Mouth/Throat:     Mouth: Mucous membranes are moist.     Pharynx: Oropharynx is clear.  Eyes:     Pupils: Pupils are equal, round, and reactive to light.  Neck:     Thyroid : No thyroid  mass or thyromegaly.     Vascular: No carotid bruit or JVD.     Trachea: Phonation normal.  Cardiovascular:     Rate and Rhythm: Normal rate and regular rhythm.  Pulmonary:     Effort: Pulmonary effort is normal. No respiratory distress.     Breath sounds: Normal breath sounds.  Abdominal:     General: Bowel sounds are normal.     Palpations: Abdomen is soft.     Tenderness: There is no abdominal tenderness.  Musculoskeletal:        General: Normal range of motion.     Cervical back: Normal range of motion and neck supple.     Comments: Pain right lateral epicondyle  Lymphadenopathy:     Cervical: No cervical adenopathy.  Skin:    General: Skin is warm and dry.  Neurological:     Mental Status: He is alert and oriented to person, place, and time.  Psychiatric:        Behavior: Behavior normal.        Thought Content: Thought content normal.        Judgment: Judgment normal.    BP (!) 187/100   Pulse 76   Temp (!) 97.4 F  (36.3 C) (Temporal)   Resp 20   Ht 6' 2 (1.88 m)   Wt 259 lb (117.5 kg)   SpO2 97%   BMI 33.25 kg/m         Assessment & Plan:  Anthony Chavez comes in today with chief complaint of Medical Management of Chronic Issues   Diagnosis and orders addressed:  1. Hypogonadism in male (Primary) Will do injections weekly - Testosterone ,Free and Total; Future - testosterone  cypionate (DEPOTESTOSTERONE CYPIONATE) 200 MG/ML injection; Inject 1 mL (200 mg total) into the skin once a week.  Dispense: 4 mL; Refill: 5  2. Essential hypertension, benign Added lisonpril to meds Check blood pressure at home - CBC with Differential/Platelet; Future - CMP14+EGFR; Future - lisinopril  (ZESTRIL ) 20 MG tablet; Take 1 tablet (20 mg total) by mouth daily.  Dispense: 90 tablet; Refill: 3  3. Mixed hyperlipidemia Low fat diet - Lipid panel; Future  4. Depression, recurrent (HCC) 5. Generalized anxiety disorder Stress management  6. Tennis elbow Tennis elbow strap- wear all tehn time Motrin OTC  Labs pending Health Maintenance reviewed Diet and exercise encouraged  Follow up plan: 6 months   Mary-Margaret Gladis, FNP

## 2023-11-09 ENCOUNTER — Other Ambulatory Visit: Payer: BC Managed Care – PPO

## 2023-11-09 DIAGNOSIS — E782 Mixed hyperlipidemia: Secondary | ICD-10-CM | POA: Diagnosis not present

## 2023-11-09 DIAGNOSIS — I10 Essential (primary) hypertension: Secondary | ICD-10-CM

## 2023-11-09 DIAGNOSIS — E291 Testicular hypofunction: Secondary | ICD-10-CM

## 2023-11-10 LAB — CMP14+EGFR
ALT: 22 [IU]/L (ref 0–44)
AST: 22 [IU]/L (ref 0–40)
Albumin: 4.4 g/dL (ref 3.9–4.9)
Alkaline Phosphatase: 52 [IU]/L (ref 44–121)
BUN/Creatinine Ratio: 14 (ref 10–24)
BUN: 24 mg/dL (ref 8–27)
Bilirubin Total: 0.5 mg/dL (ref 0.0–1.2)
CO2: 21 mmol/L (ref 20–29)
Calcium: 9.2 mg/dL (ref 8.6–10.2)
Chloride: 101 mmol/L (ref 96–106)
Creatinine, Ser: 1.66 mg/dL — ABNORMAL HIGH (ref 0.76–1.27)
Globulin, Total: 2.4 g/dL (ref 1.5–4.5)
Glucose: 101 mg/dL — ABNORMAL HIGH (ref 70–99)
Potassium: 4.4 mmol/L (ref 3.5–5.2)
Sodium: 141 mmol/L (ref 134–144)
Total Protein: 6.8 g/dL (ref 6.0–8.5)
eGFR: 47 mL/min/{1.73_m2} — ABNORMAL LOW (ref >59)

## 2023-11-10 LAB — LIPID PANEL
Chol/HDL Ratio: 4.5 {ratio} (ref 0.0–5.0)
Cholesterol, Total: 281 mg/dL — ABNORMAL HIGH (ref 100–199)
HDL: 62 mg/dL (ref >39)
LDL Chol Calc (NIH): 189 mg/dL — ABNORMAL HIGH (ref 0–99)
Triglycerides: 165 mg/dL — ABNORMAL HIGH (ref 0–149)
VLDL Cholesterol Cal: 30 mg/dL (ref 5–40)

## 2023-11-10 LAB — CBC WITH DIFFERENTIAL/PLATELET
Basophils Absolute: 0.1 10*3/uL (ref 0.0–0.2)
Basos: 1 %
EOS (ABSOLUTE): 0.1 10*3/uL (ref 0.0–0.4)
Eos: 2 %
Hematocrit: 51.6 % — ABNORMAL HIGH (ref 37.5–51.0)
Hemoglobin: 17.3 g/dL (ref 13.0–17.7)
Immature Grans (Abs): 0 10*3/uL (ref 0.0–0.1)
Immature Granulocytes: 0 %
Lymphocytes Absolute: 2.1 10*3/uL (ref 0.7–3.1)
Lymphs: 32 %
MCH: 31.4 pg (ref 26.6–33.0)
MCHC: 33.5 g/dL (ref 31.5–35.7)
MCV: 94 fL (ref 79–97)
Monocytes Absolute: 0.7 10*3/uL (ref 0.1–0.9)
Monocytes: 10 %
Neutrophils Absolute: 3.8 10*3/uL (ref 1.4–7.0)
Neutrophils: 55 %
Platelets: 275 10*3/uL (ref 150–450)
RBC: 5.51 x10E6/uL (ref 4.14–5.80)
RDW: 12.3 % (ref 11.6–15.4)
WBC: 6.7 10*3/uL (ref 3.4–10.8)

## 2023-11-10 LAB — TESTOSTERONE,FREE AND TOTAL
Testosterone, Free: 8.5 pg/mL (ref 6.6–18.1)
Testosterone: 129 ng/dL — ABNORMAL LOW (ref 264–916)

## 2023-11-13 ENCOUNTER — Telehealth: Payer: Self-pay

## 2023-11-13 NOTE — Telephone Encounter (Signed)
 Copied from CRM [id number]. Topic: Clinical - Lab/Test Results >> Nov 11, 2023  2:53 PM Fuller Mandril wrote: Reason for CRM: Patient returning call regarding lab results. Called [facility] no answer. Patient will try to access note in Mychart.

## 2023-11-13 NOTE — Telephone Encounter (Signed)
 Copied from CRM [id number]. Topic: General - Call Back - No Documentation >> Nov 11, 2023  3:43 PM Geroge Baseman wrote: Reason for CRM: Patient trying to return a call but no answer at [facility]. Please call patient again when available.

## 2023-11-14 NOTE — Telephone Encounter (Signed)
 Calling for results. Please see result note

## 2023-11-22 ENCOUNTER — Ambulatory Visit: Payer: BC Managed Care – PPO | Admitting: Nurse Practitioner

## 2023-11-22 NOTE — Progress Notes (Deleted)
   Subjective:    Patient ID: Anthony Chavez, male    DOB: [birth year], 62 y.o.   MRN: [id number]   Chief Complaint: anxiety issues  HPI  Patient Active Problem List   Diagnosis Date Noted   Hypogonadism in male 11/08/2023   Generalized anxiety disorder 05/03/2019   Depression, recurrent (HCC) 04/03/2019   Erectile dysfunction 08/25/2015   Essential hypertension, benign 08/21/2014   Hyperlipidemia 08/21/2014   Gout 08/21/2014       Review of Systems     Objective:   Physical Exam        Assessment & Plan:

## 2023-11-24 ENCOUNTER — Encounter: Payer: Self-pay | Admitting: Nurse Practitioner

## 2023-12-08 ENCOUNTER — Telehealth: Payer: Self-pay | Admitting: Pharmacy Technician

## 2023-12-08 ENCOUNTER — Other Ambulatory Visit (HOSPITAL_COMMUNITY): Payer: Self-pay

## 2023-12-08 NOTE — Telephone Encounter (Signed)
 Pharmacy Patient Advocate Encounter   Received notification from CoverMyMeds that prior authorization for Testosterone  Cypionate 200MG /ML intramuscular solution is required/requested.   Insurance verification completed.   The patient is insured through [facility] .   Per test claim: PA required; PA submitted to above mentioned insurance via CoverMyMeds Key/confirmation #/[facility] [employer] Status is pending

## 2023-12-09 ENCOUNTER — Encounter: Payer: Self-pay | Admitting: Nurse Practitioner

## 2023-12-09 ENCOUNTER — Other Ambulatory Visit (HOSPITAL_COMMUNITY): Payer: Self-pay

## 2023-12-09 ENCOUNTER — Ambulatory Visit (INDEPENDENT_AMBULATORY_CARE_PROVIDER_SITE_OTHER): Payer: BC Managed Care – PPO | Admitting: Nurse Practitioner

## 2023-12-09 VITALS — BP 135/83 | HR 90 | Temp 97.6°F | Ht 74.0 in | Wt 254.0 lb

## 2023-12-09 DIAGNOSIS — F32A Depression, unspecified: Secondary | ICD-10-CM | POA: Diagnosis not present

## 2023-12-09 DIAGNOSIS — F1020 Alcohol dependence, uncomplicated: Secondary | ICD-10-CM | POA: Diagnosis not present

## 2023-12-09 DIAGNOSIS — F419 Anxiety disorder, unspecified: Secondary | ICD-10-CM | POA: Diagnosis not present

## 2023-12-09 MED ORDER — ESCITALOPRAM OXALATE 20 MG PO TABS: 20.0000 mg | ORAL_TABLET | Freq: Every day | ORAL | 5 refills | Status: DC

## 2023-12-09 MED ORDER — BUSPIRONE HCL 15 MG PO TABS: 15.0000 mg | ORAL_TABLET | Freq: Two times a day (BID) | ORAL | 0 refills | Status: DC

## 2023-12-09 NOTE — Telephone Encounter (Signed)
 Pharmacy Patient Advocate Encounter  Received notification from [facility] that Prior Authorization for Testosterone  Cypionate 200MG /ML intramuscular solution  has been APPROVED from 12/07/2023 to 12/06/2024. Ran test claim, Copay is $69.55. This test claim was processed through [facility]- copay amounts may vary at other pharmacies due to pharmacy/plan contracts, or as the patient moves through the different stages of their insurance plan.   PA #/Case ID/Reference #: [id number]

## 2023-12-09 NOTE — Patient Instructions (Signed)

## 2023-12-09 NOTE — Progress Notes (Signed)
 Subjective:    Patient ID: Anthony Chavez, male    DOB: Feb 08, 1961, 63 y.o.   MRN: [id number]   Chief Complaint: depression and alcoholism  HPI  Patient had stressful event when serving jury duty in the fall. Unfortunately he had stopped his lexapro  and so he could not deal with this and he had a few drinks. Then he started having stress at work and that sent him again to alcohol. This pass weekend he drank an entire 5th and passed out. He has not had any alcohol since  MOnday of this week. Needs to get himself together gain Patient Active Problem List   Diagnosis Date Noted   Hypogonadism in male 11/08/2023   Generalized anxiety disorder 05/03/2019   Depression, recurrent (HCC) 04/03/2019   Erectile dysfunction 08/25/2015   Essential hypertension, benign 08/21/2014   Hyperlipidemia 08/21/2014   Gout 08/21/2014       Review of Systems  Constitutional:  Negative for diaphoresis.  Eyes:  Negative for pain.  Respiratory:  Negative for shortness of breath.   Cardiovascular:  Negative for chest pain, palpitations and leg swelling.  Gastrointestinal:  Negative for abdominal pain.  Endocrine: Negative for polydipsia.  Skin:  Negative for rash.  Neurological:  Negative for dizziness, weakness and headaches.  Hematological:  Does not bruise/bleed easily.  All other systems reviewed and are negative.      Objective:   Physical Exam Vitals and nursing note reviewed.  Constitutional:      Appearance: Normal appearance. He is well-developed.  HENT:     Head: Normocephalic.     Nose: Nose normal.     Mouth/Throat:     Mouth: Mucous membranes are moist.     Pharynx: Oropharynx is clear.  Eyes:     Pupils: Pupils are equal, round, and reactive to light.  Neck:     Thyroid : No thyroid  mass or thyromegaly.     Vascular: No carotid bruit or JVD.     Trachea: Phonation normal.  Cardiovascular:     Rate and Rhythm: Normal rate and regular rhythm.  Pulmonary:     Effort:  Pulmonary effort is normal. No respiratory distress.     Breath sounds: Normal breath sounds.  Abdominal:     General: Bowel sounds are normal.     Palpations: Abdomen is soft.     Tenderness: There is no abdominal tenderness.  Musculoskeletal:        General: Normal range of motion.     Cervical back: Normal range of motion and neck supple.  Lymphadenopathy:     Cervical: No cervical adenopathy.  Skin:    General: Skin is warm and dry.  Neurological:     Mental Status: He is alert and oriented to person, place, and time.  Psychiatric:        Behavior: Behavior normal.        Thought Content: Thought content normal.        Judgment: Judgment normal.    BP 135/83   Pulse 90   Temp 97.6 F (36.4 C) (Temporal)   Ht 6' 2 (1.88 m)   Wt 254 lb (115.2 kg)   SpO2 96%   BMI 32.61 kg/m         Assessment & Plan:   Anthony Chavez in today with chief complaint of No chief complaint on file.   1. Alcoholism (HCC) (Primary) Need  to get back  to going to AA meetings Wife encouraged to not have  ANY alcohol in the house  2. Anxiety and depression Back on lexapro  po daily Stress management discussed - escitalopram  (LEXAPRO ) 20 MG tablet; Take 1 tablet (20 mg total) by mouth daily.  Dispense: 30 tablet; Refill: 5 - busPIRone  (BUSPAR ) 15 MG tablet; Take 1 tablet (15 mg total) by mouth 2 (two) times daily.  Dispense: 60 tablet; Refill: 0    The above assessment and management plan was discussed with the patient. The patient verbalized understanding of and has agreed to the management plan. Patient is aware to call the clinic if symptoms persist or worsen. Patient is aware when to return to the clinic for a follow-up visit. Patient educated on when it is appropriate to go to the emergency department.   Mary-Margaret Gladis, FNP

## 2023-12-12 ENCOUNTER — Other Ambulatory Visit (HOSPITAL_COMMUNITY): Payer: Self-pay

## 2024-01-05 ENCOUNTER — Other Ambulatory Visit: Payer: Self-pay | Admitting: Nurse Practitioner

## 2024-01-05 MED ORDER — DISULFIRAM 250 MG PO TABS: 250.0000 mg | ORAL_TABLET | Freq: Every day | ORAL | 2 refills | Status: DC

## 2024-01-05 NOTE — Progress Notes (Signed)
 Started drinking again  Meds ordered this encounter  Medications   disulfiram (ANTABUSE) 250 MG tablet    Sig: Take 1 tablet (250 mg total) by mouth daily.    Dispense:  30 tablet    Refill:  2    Supervising Provider:   Arville Care A [[id number]]   Mary-Margaret Daphine Deutscher, FNP

## 2024-01-13 ENCOUNTER — Other Ambulatory Visit: Payer: Self-pay | Admitting: Nurse Practitioner

## 2024-01-13 DIAGNOSIS — F32A Depression, unspecified: Secondary | ICD-10-CM

## 2024-01-19 DIAGNOSIS — N179 Acute kidney failure, unspecified: Secondary | ICD-10-CM | POA: Diagnosis not present

## 2024-01-19 DIAGNOSIS — E8721 Acute metabolic acidosis: Secondary | ICD-10-CM | POA: Diagnosis not present

## 2024-01-19 DIAGNOSIS — F10239 Alcohol dependence with withdrawal, unspecified: Secondary | ICD-10-CM | POA: Diagnosis not present

## 2024-01-19 DIAGNOSIS — R45851 Suicidal ideations: Secondary | ICD-10-CM | POA: Diagnosis not present

## 2024-01-20 DIAGNOSIS — F10930 Alcohol use, unspecified with withdrawal, uncomplicated: Secondary | ICD-10-CM | POA: Diagnosis not present

## 2024-01-21 DIAGNOSIS — F1020 Alcohol dependence, uncomplicated: Secondary | ICD-10-CM | POA: Diagnosis not present

## 2024-01-21 DIAGNOSIS — F32A Depression, unspecified: Secondary | ICD-10-CM | POA: Diagnosis not present

## 2024-01-21 DIAGNOSIS — F10930 Alcohol use, unspecified with withdrawal, uncomplicated: Secondary | ICD-10-CM | POA: Diagnosis not present

## 2024-01-22 DIAGNOSIS — F10930 Alcohol use, unspecified with withdrawal, uncomplicated: Secondary | ICD-10-CM | POA: Diagnosis not present

## 2024-01-24 DIAGNOSIS — N179 Acute kidney failure, unspecified: Secondary | ICD-10-CM | POA: Diagnosis not present

## 2024-01-24 DIAGNOSIS — G8929 Other chronic pain: Secondary | ICD-10-CM | POA: Diagnosis not present

## 2024-01-24 DIAGNOSIS — I10 Essential (primary) hypertension: Secondary | ICD-10-CM | POA: Diagnosis not present

## 2024-01-24 DIAGNOSIS — Z9151 Personal history of suicidal behavior: Secondary | ICD-10-CM | POA: Diagnosis not present

## 2024-01-24 DIAGNOSIS — F332 Major depressive disorder, recurrent severe without psychotic features: Secondary | ICD-10-CM | POA: Diagnosis not present

## 2024-01-24 DIAGNOSIS — F32A Depression, unspecified: Secondary | ICD-10-CM | POA: Diagnosis not present

## 2024-01-24 DIAGNOSIS — R45851 Suicidal ideations: Secondary | ICD-10-CM | POA: Diagnosis not present

## 2024-01-24 DIAGNOSIS — F1020 Alcohol dependence, uncomplicated: Secondary | ICD-10-CM | POA: Diagnosis not present

## 2024-01-24 DIAGNOSIS — Z89422 Acquired absence of other left toe(s): Secondary | ICD-10-CM | POA: Diagnosis not present

## 2024-01-24 DIAGNOSIS — Z79899 Other long term (current) drug therapy: Secondary | ICD-10-CM | POA: Diagnosis not present

## 2024-01-24 DIAGNOSIS — M25511 Pain in right shoulder: Secondary | ICD-10-CM | POA: Diagnosis not present

## 2024-01-24 DIAGNOSIS — Z653 Problems related to other legal circumstances: Secondary | ICD-10-CM | POA: Diagnosis not present

## 2024-01-24 DIAGNOSIS — M25512 Pain in left shoulder: Secondary | ICD-10-CM | POA: Diagnosis not present

## 2024-01-24 DIAGNOSIS — Z560 Unemployment, unspecified: Secondary | ICD-10-CM | POA: Diagnosis not present

## 2024-01-26 DIAGNOSIS — F1020 Alcohol dependence, uncomplicated: Secondary | ICD-10-CM | POA: Diagnosis not present

## 2024-02-27 DIAGNOSIS — F1020 Alcohol dependence, uncomplicated: Secondary | ICD-10-CM | POA: Diagnosis not present

## 2024-02-28 DIAGNOSIS — F1020 Alcohol dependence, uncomplicated: Secondary | ICD-10-CM | POA: Diagnosis not present

## 2024-02-29 DIAGNOSIS — F1020 Alcohol dependence, uncomplicated: Secondary | ICD-10-CM | POA: Diagnosis not present

## 2024-03-01 DIAGNOSIS — F1020 Alcohol dependence, uncomplicated: Secondary | ICD-10-CM | POA: Diagnosis not present

## 2024-03-02 DIAGNOSIS — F1020 Alcohol dependence, uncomplicated: Secondary | ICD-10-CM | POA: Diagnosis not present

## 2024-03-05 DIAGNOSIS — F1020 Alcohol dependence, uncomplicated: Secondary | ICD-10-CM | POA: Diagnosis not present

## 2024-03-06 DIAGNOSIS — F1020 Alcohol dependence, uncomplicated: Secondary | ICD-10-CM | POA: Diagnosis not present

## 2024-03-07 DIAGNOSIS — F1020 Alcohol dependence, uncomplicated: Secondary | ICD-10-CM | POA: Diagnosis not present

## 2024-03-08 DIAGNOSIS — F1020 Alcohol dependence, uncomplicated: Secondary | ICD-10-CM | POA: Diagnosis not present

## 2024-03-09 DIAGNOSIS — F1020 Alcohol dependence, uncomplicated: Secondary | ICD-10-CM | POA: Diagnosis not present

## 2024-03-12 DIAGNOSIS — F1020 Alcohol dependence, uncomplicated: Secondary | ICD-10-CM | POA: Diagnosis not present

## 2024-03-13 DIAGNOSIS — F1020 Alcohol dependence, uncomplicated: Secondary | ICD-10-CM | POA: Diagnosis not present

## 2024-03-14 DIAGNOSIS — F1020 Alcohol dependence, uncomplicated: Secondary | ICD-10-CM | POA: Diagnosis not present

## 2024-03-15 DIAGNOSIS — F1020 Alcohol dependence, uncomplicated: Secondary | ICD-10-CM | POA: Diagnosis not present

## 2024-03-16 DIAGNOSIS — F1020 Alcohol dependence, uncomplicated: Secondary | ICD-10-CM | POA: Diagnosis not present

## 2024-03-19 DIAGNOSIS — F1020 Alcohol dependence, uncomplicated: Secondary | ICD-10-CM | POA: Diagnosis not present

## 2024-03-20 DIAGNOSIS — F1020 Alcohol dependence, uncomplicated: Secondary | ICD-10-CM | POA: Diagnosis not present

## 2024-03-21 DIAGNOSIS — F1020 Alcohol dependence, uncomplicated: Secondary | ICD-10-CM | POA: Diagnosis not present

## 2024-03-22 DIAGNOSIS — F1020 Alcohol dependence, uncomplicated: Secondary | ICD-10-CM | POA: Diagnosis not present

## 2024-03-23 DIAGNOSIS — F1020 Alcohol dependence, uncomplicated: Secondary | ICD-10-CM | POA: Diagnosis not present

## 2024-04-04 ENCOUNTER — Other Ambulatory Visit: Payer: Self-pay | Admitting: Nurse Practitioner

## 2024-04-04 DIAGNOSIS — F32A Depression, unspecified: Secondary | ICD-10-CM

## 2024-05-08 ENCOUNTER — Ambulatory Visit (INDEPENDENT_AMBULATORY_CARE_PROVIDER_SITE_OTHER): Payer: BC Managed Care – PPO | Admitting: Nurse Practitioner

## 2024-05-08 ENCOUNTER — Other Ambulatory Visit: Payer: Self-pay | Admitting: Nurse Practitioner

## 2024-05-08 VITALS — BP 151/85 | HR 81 | Temp 98.0°F | Ht 74.0 in | Wt 254.0 lb

## 2024-05-08 DIAGNOSIS — E291 Testicular hypofunction: Secondary | ICD-10-CM

## 2024-05-08 DIAGNOSIS — E782 Mixed hyperlipidemia: Secondary | ICD-10-CM

## 2024-05-08 DIAGNOSIS — F411 Generalized anxiety disorder: Secondary | ICD-10-CM

## 2024-05-08 DIAGNOSIS — I10 Essential (primary) hypertension: Secondary | ICD-10-CM | POA: Diagnosis not present

## 2024-05-08 DIAGNOSIS — F339 Major depressive disorder, recurrent, unspecified: Secondary | ICD-10-CM

## 2024-05-08 DIAGNOSIS — F32A Depression, unspecified: Secondary | ICD-10-CM

## 2024-05-08 NOTE — Progress Notes (Signed)
 Subjective:    Patient ID: QUAVION Chavez, male    DOB: 12-Dec-1960, 63 y.o.   MRN: [id number]   Chief Complaint: medical management of chronic issues     HPI:  Anthony Chavez is a 63 y.o. who identifies as a male who was assigned male at birth.   Social history: Lives with: wife and 2 sons Work history: works evening sifts   Comes in today for follow up of the following chronic medical issues:  1. Essential hypertension, benign No c/o chest pain, sob or headache. Does  check blood pressure at home and he says has been running good. BP Readings from Last 3 Encounters:  12/09/23 135/83  11/08/23 (!) 187/100  05/16/23 (!) 156/87     2. Mixed hyperlipidemia Does try to watch diet and stays very active. Lab Results  Component Value Date   CHOL 281 (H) 11/09/2023   HDL 62 11/09/2023   LDLCALC 189 (H) 11/09/2023   TRIG 165 (H) 11/09/2023   CHOLHDL 4.5 11/09/2023   The ASCVD Risk score (Arnett DK, et al., 2019) failed to calculate for the following reasons:   Risk score cannot be calculated because patient has a medical history suggesting prior/existing ASCVD   3. Depression, recurrent (HCC) Has been on lexapro  for several years. Doing well.    05/08/2024   10:18 AM 12/09/2023   12:11 PM 11/08/2023   11:15 AM  Depression screen PHQ 2/9  Decreased Interest 0 1 0  Down, Depressed, Hopeless 0 2 0  PHQ - 2 Score 0 3 0  Altered sleeping 0 2 1  Tired, decreased energy 0 1 1  Change in appetite 0 1 0  Feeling bad or failure about yourself  0 1 0  Trouble concentrating 0 0 0  Moving slowly or fidgety/restless 0 0 0  Suicidal thoughts 0 0 0  PHQ-9 Score 0 8 2  Difficult doing work/chores Not difficult at all Somewhat difficult Not difficult at all      4. Generalized anxiety disorder Has had anxiety for years. Lexapro  really helps.    05/08/2024   10:18 AM 12/09/2023   12:11 PM 11/08/2023   11:15 AM 05/16/2023   12:02 PM  GAD 7 : Generalized Anxiety Score  Nervous,  Anxious, on Edge 0 1 0 0  Control/stop worrying 0 1 0 0  Worry too much - different things 0 1 0 0  Trouble relaxing 0 2 0 0  Restless 0 1 0 0  Easily annoyed or irritable 0 1 0 0  Afraid - awful might happen 0 0 0 0  Total GAD 7 Score 0 7 0 0  Anxiety Difficulty Not difficult at all Somewhat difficult Not difficult at all Not difficult at all         New complaints: Patient has long history of alcoholism. Was attending AA meetings and was supposively , on the wagon as they sat. The truth was he was still drinking. Had a drinking binge while his wife was of town several months ago and threatened to kill hisself. The police came and had him committed. He was in in house rehab for a few weeks then went to out patient live in facility for rehab. When he came home he started drinking again several weeks  later. His wife is really struggling with all of this. She is no longer being supportive.   No Known Allergies Outpatient Encounter Medications as of 05/08/2024  Medication Sig   busPIRone  (  BUSPAR ) 15 MG tablet TAKE 1 TABLET BY MOUTH TWICE A DAY   disulfiram  (ANTABUSE ) 250 MG tablet Take 1 tablet (250 mg total) by mouth daily.   escitalopram  (LEXAPRO ) 20 MG tablet TAKE 1 TABLET BY MOUTH EVERY DAY   folic acid  (FOLVITE ) 1 MG tablet Take 1 mg by mouth every morning.   lisinopril  (ZESTRIL ) 20 MG tablet Take 1 tablet (20 mg total) by mouth daily.   magnesium oxide (MAG-OX) 400 (241.3 Mg) MG tablet Take 1 tablet by mouth 2 (two) times daily.   Melatonin 10 MG CAPS Take 1 capsule by mouth at bedtime.   testosterone  cypionate (DEPOTESTOSTERONE CYPIONATE) 200 MG/ML injection Inject 1 mL (200 mg total) into the skin once a week.   thiamine  100 MG tablet Take 100 mg by mouth daily.   No facility-administered encounter medications on file as of 05/08/2024.    Past Surgical History:  Procedure Laterality Date   HERNIA REPAIR     ring finger surgery Left    TONSILLECTOMY      No family history  on file.    Controlled substance contract: n/a     Review of Systems  Constitutional:  Negative for diaphoresis.  Eyes:  Negative for pain.  Respiratory:  Negative for shortness of breath.   Cardiovascular:  Negative for chest pain, palpitations and leg swelling.  Gastrointestinal:  Negative for abdominal pain.  Endocrine: Negative for polydipsia.  Skin:  Negative for rash.  Neurological:  Negative for dizziness, weakness and headaches.  Hematological:  Does not bruise/bleed easily.  All other systems reviewed and are negative.      Objective:   Physical Exam Vitals and nursing note reviewed.  Constitutional:      Appearance: Normal appearance. He is well-developed.  HENT:     Head: Normocephalic.     Nose: Nose normal.     Mouth/Throat:     Mouth: Mucous membranes are moist.     Pharynx: Oropharynx is clear.  Eyes:     Pupils: Pupils are equal, round, and reactive to light.  Neck:     Thyroid : No thyroid  mass or thyromegaly.     Vascular: No carotid bruit or JVD.     Trachea: Phonation normal.  Cardiovascular:     Rate and Rhythm: Normal rate and regular rhythm.  Pulmonary:     Effort: Pulmonary effort is normal. No respiratory distress.     Breath sounds: Normal breath sounds.  Abdominal:     General: Bowel sounds are normal.     Palpations: Abdomen is soft.     Tenderness: There is no abdominal tenderness.  Musculoskeletal:        General: Normal range of motion.     Cervical back: Normal range of motion and neck supple.     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 Mild erythema and warm to touch.  Lymphadenopathy:     Cervical: No cervical adenopathy.  Skin:    General: Skin is warm and dry.  Neurological:     Mental Status: He is alert and oriented to person, place, and time.  Psychiatric:        Behavior: Behavior normal.        Thought Content: Thought content normal.        Judgment: Judgment normal.    BP (!) 151/85   Pulse 81    Temp 98 F (36.7 C) (Temporal)   Ht 6' 2 (1.88 m)   Wt 254 lb (115.2  kg)   SpO2 98%   BMI 32.61 kg/m          Assessment & Plan:    Anthony Chavez comes in today with chief complaint of medical management of chronic issues    Diagnosis and orders addressed:  1. Essential hypertension, benign Low sodium diet - CBC with Differential/Platelet - CMP14+EGFR  2. Mixed hyperlipidemia Low fat diet - rosuvastatin  (CRESTOR ) 10 MG tablet; Take 1 tablet (10 mg total) by mouth daily.  Dispense: 90 tablet; Refill: 1 - Lipid panel  3. Depression, recurrent (HCC) Stress manageemnt - escitalopram  (LEXAPRO ) 20 MG tablet; Take 1 tablet (20 mg total) by mouth daily.  Dispense: 90 tablet; Refill: 1  4. Generalized anxiety disorder  5. Alcoholism Long discussion about AA Discussed meds  to help him quit drinking Needs  to work on himself- his wife has to change herself  Labs pending Health Maintenance reviewed Diet and exercise encouraged  Follow up plan: 6 months   Mary-Margaret Gladis, FNP

## 2024-05-24 NOTE — Telephone Encounter (Unsigned)
 Copied from CRM [id number]. Topic: Clinical - Prescription Issue >> May 23, 2024  4:39 PM Tobias CROME wrote: Reason for CRM: [facility] about getting busPIRone  (BUSPAR ) 15 MG tablet, 3 times daily. Patient states that prescription was sent in by the hospital but was informed that the provider needs to give approval.   Patient unsure if prior authorization is needed or approval. Patient states he was seen by hospital about 3 days ago and his dosage was changed then. Patient requesting assistance with getting prescription as he is close to running out.

## 2024-06-03 ENCOUNTER — Other Ambulatory Visit: Payer: Self-pay | Admitting: Nurse Practitioner

## 2024-06-03 DIAGNOSIS — E291 Testicular hypofunction: Secondary | ICD-10-CM

## 2024-06-12 ENCOUNTER — Telehealth: Payer: Self-pay

## 2024-06-12 NOTE — Telephone Encounter (Signed)
 Please review and advise.

## 2024-06-12 NOTE — Telephone Encounter (Signed)
 Patient will discuss options with Ronal Quant at appt on Friday.

## 2024-06-12 NOTE — Telephone Encounter (Signed)
 Copied from CRM [id number]. Topic: Clinical - Medical Advice >> Jun 12, 2024 11:51 AM Emylou G wrote: Reason for CRM: Patient called.. wants to know if he can get the vivitrol that day of his appt Friday 15th and can he be treated as inpatient so it can be ins covered?  Needs to know in advance before appt

## 2024-06-12 NOTE — Telephone Encounter (Signed)
 We will give him prescription for meds. He will pick up from pharmacy then come back and et shot. We can not treat as inpatient because we ar not an in patient facility

## 2024-06-14 DIAGNOSIS — F1020 Alcohol dependence, uncomplicated: Secondary | ICD-10-CM | POA: Diagnosis not present

## 2024-06-15 ENCOUNTER — Inpatient Hospital Stay: Admitting: Nurse Practitioner

## 2024-06-21 DIAGNOSIS — F1020 Alcohol dependence, uncomplicated: Secondary | ICD-10-CM | POA: Diagnosis not present

## 2024-07-13 ENCOUNTER — Ambulatory Visit: Payer: Self-pay

## 2024-07-13 MED ORDER — PREDNISONE 20 MG PO TABS: 40.0000 mg | ORAL_TABLET | Freq: Every day | ORAL | 0 refills | Status: AC

## 2024-07-13 NOTE — Telephone Encounter (Signed)
 Patient thinks he has gout. Please advise on meds

## 2024-07-13 NOTE — Addendum Note (Signed)
 Addended by: Saleha Kalp, MARY-MARGARET on: 07/13/2024 04:35 PM   Modules accepted: Orders

## 2024-07-13 NOTE — Telephone Encounter (Signed)
 FYI Only or Action Required?: Action required by provider: update on patient condition and requests prednisone  rx for gout flair.  Patient was last seen in primary care on 05/08/2024 by Gladis Mustard, FNP.  Called Nurse Triage reporting Toe Pain.  Symptoms began several days ago.  Interventions attempted: Nothing.  Symptoms are: gradually worsening.  Triage Disposition: See Physician Within 24 Hours  Patient/caregiver understands and will follow disposition?: No, wishes to speak with PCP  Message from [facility] sent at 07/13/2024 12:36 PM EDT  Summary: right big toe concern / rx req   The patient shares that they have began to experience discomfort in their foot for roughly 3 days, specifically located in their right big toe. The patient shares that they have a history of concerns related to gout and would like to speak with a member of clinical staff when possible      Reason for Disposition  [1] Swollen toe AND [2] no fever  (Exceptions: Just a localized bump from bunion, corns, insect bite, sting.)  Answer Assessment - Initial Assessment Questions Additional info: History of gout in right foot, has not had a flair in a long time. He has a new job and on his feet a lot now. He was maintained on Allopurinol  but has not been prescribed this for some time now. In the past during gout flair he was treated with Prednisone , he states this will clear the flair in 2-3 days. Next available primary care appointment is not available until Monday, he is requesting prednisone  to [facility]. Patient appreciates a call to let him know if script will be sent or if he will be required to schedule an appointment.     1. ONSET: When did the pain start?      3 days ago 2. LOCATION: Where is the pain located?   (e.g., around nail, entire toe, at foot joint)      Right great toe at base 3. PAIN: How bad is the pain?    (Scale 1-10; or mild, moderate, severe)     severe 4.  APPEARANCE: What does the toe look like? (e.g., redness, swelling, bruising, pallor)     Swollen  5. CAUSE: What do you think is causing the toe pain?     gout 6. OTHER SYMPTOMS: Do you have any other symptoms? (e.g., leg pain, rash, fever, numbness)     denies  Protocols used: Toe Pain-A-AH

## 2024-07-16 NOTE — Telephone Encounter (Signed)
 Pt did not see MyChart message on Friday evening about Prednisone  being sent in, did go to Urgent care in [place], but will pickup prescription.

## 2024-07-24 DIAGNOSIS — F1020 Alcohol dependence, uncomplicated: Secondary | ICD-10-CM | POA: Diagnosis not present

## 2024-08-12 ENCOUNTER — Other Ambulatory Visit: Payer: Self-pay | Admitting: Nurse Practitioner

## 2024-08-12 DIAGNOSIS — F32A Depression, unspecified: Secondary | ICD-10-CM

## 2024-10-09 ENCOUNTER — Other Ambulatory Visit: Payer: Self-pay | Admitting: Nurse Practitioner

## 2024-10-09 DIAGNOSIS — F419 Anxiety disorder, unspecified: Secondary | ICD-10-CM

## 2024-10-10 NOTE — Telephone Encounter (Signed)
 Appt 11-06-2024 with MMM

## 2024-10-10 NOTE — Telephone Encounter (Signed)
 MMM NTBS in Jan for 6 mos FU RF sent to pharmacy

## 2024-11-06 ENCOUNTER — Ambulatory Visit: Payer: Self-pay | Admitting: Nurse Practitioner

## 2024-11-15 ENCOUNTER — Ambulatory Visit: Payer: Self-pay | Admitting: Nurse Practitioner

## 2024-11-15 ENCOUNTER — Encounter: Payer: Self-pay | Admitting: Nurse Practitioner

## 2024-11-15 VITALS — BP 176/82 | HR 78 | Temp 98.4°F | Ht 74.0 in | Wt 251.0 lb

## 2024-11-15 DIAGNOSIS — F32A Depression, unspecified: Secondary | ICD-10-CM

## 2024-11-15 DIAGNOSIS — F339 Major depressive disorder, recurrent, unspecified: Secondary | ICD-10-CM

## 2024-11-15 DIAGNOSIS — F411 Generalized anxiety disorder: Secondary | ICD-10-CM

## 2024-11-15 DIAGNOSIS — I10 Essential (primary) hypertension: Secondary | ICD-10-CM | POA: Diagnosis not present

## 2024-11-15 DIAGNOSIS — E291 Testicular hypofunction: Secondary | ICD-10-CM

## 2024-11-15 DIAGNOSIS — E782 Mixed hyperlipidemia: Secondary | ICD-10-CM

## 2024-11-15 LAB — LIPID PANEL

## 2024-11-15 MED ORDER — LISINOPRIL 40 MG PO TABS: 40.0000 mg | ORAL_TABLET | Freq: Every day | ORAL | 1 refills | Status: AC

## 2024-11-15 MED ORDER — BUSPIRONE HCL 15 MG PO TABS: 15.0000 mg | ORAL_TABLET | Freq: Two times a day (BID) | ORAL | 1 refills | Status: AC

## 2024-11-15 MED ORDER — ESCITALOPRAM OXALATE 20 MG PO TABS: 20.0000 mg | ORAL_TABLET | Freq: Every day | ORAL | 1 refills | Status: AC

## 2024-11-15 MED ORDER — TESTOSTERONE CYPIONATE 200 MG/ML IM SOLN: 200.0000 mg | INTRAMUSCULAR | 5 refills | Status: AC

## 2024-11-15 NOTE — Patient Instructions (Signed)

## 2024-11-15 NOTE — Progress Notes (Signed)
 "  Subjective:    Patient ID: Anthony Chavez, male    DOB: November 02, 1960, 64 y.o.   MRN: [id number]   Chief Complaint: medical management of chronic issues     HPI:  Anthony Chavez is a 64 y.o. who identifies as a male who was assigned male at birth.   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   Comes in today for follow up of the following chronic medical issues:  1. Essential hypertension, benign No c/o chest pain, sob or headache. Does  check blood pressure at home and he says has been running good. BP Readings from Last 3 Encounters:  05/08/24 (!) 151/85  12/09/23 135/83  11/08/23 (!) 187/100     2. Mixed hyperlipidemia Does try to watch diet and stays very active. Lab Results  Component Value Date   CHOL 281 (H) 11/09/2023   HDL 62 11/09/2023   LDLCALC 189 (H) 11/09/2023   TRIG 165 (H) 11/09/2023   CHOLHDL 4.5 11/09/2023   The ASCVD Risk score (Arnett DK, et al., 2019) failed to calculate for the following reasons:   Risk score cannot be calculated because patient has a medical history suggesting prior/existing ASCVD   * - Cholesterol units were assumed   3. Depression, recurrent (HCC) Has been on lexapro  for several years. Doing well.    11/15/2024    2:23 PM 05/08/2024   10:18 AM 12/09/2023   12:11 PM 11/08/2023   11:15 AM 05/16/2023   12:02 PM  Depression screen PHQ 2/9  Decreased Interest 0 0 1 0 0  Down, Depressed, Hopeless 0 0 2 0 0  PHQ - 2 Score 0 0 3 0 0  Altered sleeping 0 0 2 1 1   Tired, decreased energy 0 0 1 1 1   Change in appetite 0 0 1 0 0  Feeling bad or failure about yourself  0 0 1 0 0  Trouble concentrating 0 0 0 0 0  Moving slowly or fidgety/restless 0 0 0 0 0  Suicidal thoughts 0 0 0 0 0  PHQ-9 Score 0 0  8  2  2    Difficult doing work/chores Not difficult at all Not difficult at all Somewhat difficult Not difficult at all Not difficult at  all     Data saved with a previous flowsheet row definition        4. Generalized anxiety disorder Has had anxiety for years. Lexapro  really helps.    11/15/2024    2:23 PM 05/08/2024   10:18 AM 12/09/2023   12:11 PM 11/08/2023   11:15 AM  GAD 7 : Generalized Anxiety Score  Nervous, Anxious, on Edge 0 0 1 0  Control/stop worrying 0 0 1 0  Worry too much - different things 0 0 1 0  Trouble relaxing 0 0 2 0  Restless 0 0 1 0  Easily annoyed or irritable 0 0 1 0  Afraid - awful might happen 0 0 0 0  Total GAD 7 Score 0 0 7 0  Anxiety Difficulty Not difficult at all Not difficult at all Somewhat difficult Not difficult at all       5. alcoholism Patient is on injections monthly for alcoholism. He has had several set backs of drinking when he was not able  to get is injection. He has been really struggling. The shots help but don't completely deter him.  New complaints: None today  No Known Allergies Outpatient Encounter Medications as of 11/15/2024  Medication Sig   busPIRone  (BUSPAR ) 15 MG tablet TAKE 1 TABLET BY MOUTH TWICE A DAY   disulfiram  (ANTABUSE ) 250 MG tablet Take 1 tablet (250 mg total) by mouth daily. (Patient not taking: Reported on 05/08/2024)   escitalopram  (LEXAPRO ) 20 MG tablet TAKE 1 TABLET BY MOUTH EVERY DAY   folic acid  (FOLVITE ) 1 MG tablet Take 1 mg by mouth every morning. (Patient not taking: Reported on 05/08/2024)   lisinopril  (ZESTRIL ) 20 MG tablet Take 1 tablet (20 mg total) by mouth daily. (Patient not taking: Reported on 05/08/2024)   magnesium oxide (MAG-OX) 400 (241.3 Mg) MG tablet Take 1 tablet by mouth 2 (two) times daily. (Patient not taking: Reported on 05/08/2024)   Melatonin 10 MG CAPS Take 1 capsule by mouth at bedtime.   testosterone  cypionate (DEPOTESTOSTERONE CYPIONATE) 200 MG/ML injection INJECT 1 ML (200 MG TOTAL) INTO THE SKIN ONCE A WEEK.   thiamine  100 MG tablet Take 100 mg by mouth daily. (Patient not taking: Reported on 05/08/2024)   No  facility-administered encounter medications on file as of 11/15/2024.    Past Surgical History:  Procedure Laterality Date   HERNIA REPAIR     ring finger surgery Left    TONSILLECTOMY      No family history on file.    Controlled substance contract: n/a     Review of Systems  Constitutional:  Negative for diaphoresis.  Eyes:  Negative for pain.  Respiratory:  Negative for shortness of breath.   Cardiovascular:  Negative for chest pain, palpitations and leg swelling.  Gastrointestinal:  Negative for abdominal pain.  Endocrine: Negative for polydipsia.  Skin:  Negative for rash.  Neurological:  Negative for dizziness, weakness and headaches.  Hematological:  Does not bruise/bleed easily.  All other systems reviewed and are negative.      Objective:   Physical Exam Vitals and nursing note reviewed.  Constitutional:      Appearance: Normal appearance. He is well-developed.  HENT:     Head: Normocephalic.     Nose: Nose normal.     Mouth/Throat:     Mouth: Mucous membranes are moist.     Pharynx: Oropharynx is clear.  Eyes:     Pupils: Pupils are equal, round, and reactive to light.  Neck:     Thyroid : No thyroid  mass or thyromegaly.     Vascular: No carotid bruit or JVD.     Trachea: Phonation normal.  Cardiovascular:     Rate and Rhythm: Normal rate and regular rhythm.  Pulmonary:     Effort: Pulmonary effort is normal. No respiratory distress.     Breath sounds: Normal breath sounds.  Abdominal:     General: Bowel sounds are normal.     Palpations: Abdomen is soft.     Tenderness: There is no abdominal tenderness.  Musculoskeletal:        General: Normal range of motion.     Cervical back: Normal range of motion and neck supple.     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 Mild erythema and warm to touch.  Lymphadenopathy:     Cervical: No cervical adenopathy.  Skin:    General: Skin is warm and dry.  Neurological:     Mental Status: He  is alert and oriented to person, place, and time.  Psychiatric:        Behavior: Behavior normal.  Thought Content: Thought content normal.        Judgment: Judgment normal.    BP (!) 176/82   Pulse 78   Temp 98.4 F (36.9 C) (Temporal)   Ht 6' 2 (1.88 m)   Wt 251 lb (113.9 kg)   SpO2 94%   BMI 32.23 kg/m         Assessment & Plan:   AKHIL PISCOPO comes in today with chief complaint of Medical Management of Chronic Issues   Diagnosis and orders addressed:  1. Essential hypertension, benign (Primary) Dash diet Increase lisinopril  to 40mg  daily - lisinopril  (ZESTRIL ) 40 MG tablet; Take 1 tablet (40 mg total) by mouth daily.  Dispense: 90 tablet; Refill: 1 - CBC with Differential/Platelet - CMP14+EGFR  2. Anxiety and depression Stress management - escitalopram  (LEXAPRO ) 20 MG tablet; Take 1 tablet (20 mg total) by mouth daily.  Dispense: 90 tablet; Refill: 1 - busPIRone  (BUSPAR ) 15 MG tablet; Take 1 tablet (15 mg total) by mouth 2 (two) times daily.  Dispense: 180 tablet; Refill: 1  3. Hypogonadism in male - testosterone  cypionate (DEPOTESTOSTERONE CYPIONATE) 200 MG/ML injection; Inject 1 mL (200 mg total) into the skin once a week.  Dispense: 4 mL; Refill: 5  4. Mixed hyperlipidemia Low fat diet - Lipid panel  5. Depression, recurrent Stress management  6. Generalized anxiety disorder  7. Alcoholism Encouaged AA meetings get back in the gym  Labs pending Health Maintenance reviewed Diet and exercise encouraged  Follow up plan: 6 months   Mary-Margaret Gladis, FNP  "

## 2024-11-16 ENCOUNTER — Ambulatory Visit: Payer: Self-pay | Admitting: Nurse Practitioner

## 2024-11-16 LAB — LIPID PANEL
Cholesterol, Total: 243 mg/dL — AB (ref 100–199)
HDL: 77 mg/dL
LDL CALC COMMENT:: 3.2 ratio (ref 0.0–5.0)
LDL Chol Calc (NIH): 154 mg/dL — AB (ref 0–99)
Triglycerides: 71 mg/dL (ref 0–149)
VLDL Cholesterol Cal: 12 mg/dL (ref 5–40)

## 2024-11-16 LAB — CBC WITH DIFFERENTIAL/PLATELET
Basophils Absolute: 0.1 x10E3/uL (ref 0.0–0.2)
Basos: 1 %
EOS (ABSOLUTE): 0.1 x10E3/uL (ref 0.0–0.4)
Eos: 2 %
Hematocrit: 48.8 % (ref 37.5–51.0)
Hemoglobin: 15.9 g/dL (ref 13.0–17.7)
Immature Grans (Abs): 0 x10E3/uL (ref 0.0–0.1)
Immature Granulocytes: 0 %
Lymphocytes Absolute: 2 x10E3/uL (ref 0.7–3.1)
Lymphs: 37 %
MCH: 32 pg (ref 26.6–33.0)
MCHC: 32.6 g/dL (ref 31.5–35.7)
MCV: 98 fL — ABNORMAL HIGH (ref 79–97)
Monocytes Absolute: 0.4 x10E3/uL (ref 0.1–0.9)
Monocytes: 7 %
Neutrophils Absolute: 2.9 x10E3/uL (ref 1.4–7.0)
Neutrophils: 53 %
Platelets: 286 x10E3/uL (ref 150–450)
RBC: 4.97 x10E6/uL (ref 4.14–5.80)
RDW: 12.6 % (ref 11.6–15.4)
WBC: 5.4 x10E3/uL (ref 3.4–10.8)

## 2024-11-16 LAB — CMP14+EGFR
ALT: 17 IU/L (ref 0–44)
AST: 18 IU/L (ref 0–40)
Albumin: 3.9 g/dL (ref 3.9–4.9)
Alkaline Phosphatase: 46 IU/L — AB (ref 47–123)
BUN/Creatinine Ratio: 13 (ref 10–24)
BUN: 17 mg/dL (ref 8–27)
Bilirubin Total: 0.4 mg/dL (ref 0.0–1.2)
CO2: 23 mmol/L (ref 20–29)
Calcium: 8.9 mg/dL (ref 8.6–10.2)
Chloride: 102 mmol/L (ref 96–106)
Creatinine, Ser: 1.28 mg/dL — AB (ref 0.76–1.27)
Globulin, Total: 2.6 g/dL (ref 1.5–4.5)
Glucose: 83 mg/dL (ref 70–99)
Potassium: 4 mmol/L (ref 3.5–5.2)
Sodium: 142 mmol/L (ref 134–144)
Total Protein: 6.5 g/dL (ref 6.0–8.5)
eGFR: 63 mL/min/1.73

## 2025-05-16 ENCOUNTER — Ambulatory Visit: Admitting: Nurse Practitioner
